# Patient Record
Sex: Female | Born: 1965 | Race: White | Hispanic: No | Marital: Married | Smoking: Never smoker
Health system: Southern US, Community
[De-identification: ages and names within clinical notes are randomized; demographics above are authoritative.]

## PROBLEM LIST (undated history)

## (undated) DIAGNOSIS — K5989 Other specified functional intestinal disorders: Secondary | ICD-10-CM

## (undated) DIAGNOSIS — K589 Irritable bowel syndrome without diarrhea: Secondary | ICD-10-CM

## (undated) DIAGNOSIS — E079 Disorder of thyroid, unspecified: Secondary | ICD-10-CM

## (undated) HISTORY — PX: OTHER SURGICAL HISTORY: SHX169

## (undated) HISTORY — PX: KNEE SURGERY: SHX244

## (undated) HISTORY — DX: Disorder of thyroid, unspecified: E07.9

---
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
## 2000-11-02 ENCOUNTER — Other Ambulatory Visit: Admission: RE | Admit: 2000-11-02 | Discharge: 2000-11-02 | Payer: Self-pay | Admitting: Obstetrics & Gynecology

## 2002-03-17 ENCOUNTER — Other Ambulatory Visit: Admission: RE | Admit: 2002-03-17 | Discharge: 2002-03-17 | Payer: Self-pay | Admitting: Obstetrics & Gynecology

## 2003-05-14 ENCOUNTER — Other Ambulatory Visit: Admission: RE | Admit: 2003-05-14 | Discharge: 2003-05-14 | Payer: Self-pay | Admitting: Obstetrics & Gynecology

## 2004-08-18 ENCOUNTER — Other Ambulatory Visit: Admission: RE | Admit: 2004-08-18 | Discharge: 2004-08-18 | Payer: Self-pay | Admitting: Obstetrics and Gynecology

## 2007-03-31 ENCOUNTER — Ambulatory Visit (HOSPITAL_COMMUNITY): Admission: RE | Admit: 2007-03-31 | Discharge: 2007-03-31 | Payer: Self-pay | Admitting: Obstetrics & Gynecology

## 2009-01-02 ENCOUNTER — Encounter: Admission: RE | Admit: 2009-01-02 | Discharge: 2009-01-02 | Payer: Self-pay | Admitting: Surgery

## 2009-03-06 ENCOUNTER — Encounter: Admission: RE | Admit: 2009-03-06 | Discharge: 2009-03-06 | Payer: Self-pay | Admitting: Gastroenterology

## 2011-01-29 ENCOUNTER — Ambulatory Visit: Payer: Managed Care, Other (non HMO)

## 2011-01-29 DIAGNOSIS — R1084 Generalized abdominal pain: Secondary | ICD-10-CM

## 2011-01-29 DIAGNOSIS — R509 Fever, unspecified: Secondary | ICD-10-CM

## 2011-01-29 DIAGNOSIS — R05 Cough: Secondary | ICD-10-CM

## 2011-02-16 ENCOUNTER — Ambulatory Visit: Payer: Managed Care, Other (non HMO) | Admitting: Internal Medicine

## 2011-05-25 ENCOUNTER — Encounter (INDEPENDENT_AMBULATORY_CARE_PROVIDER_SITE_OTHER): Payer: Self-pay | Admitting: Surgery

## 2011-06-09 ENCOUNTER — Telehealth (INDEPENDENT_AMBULATORY_CARE_PROVIDER_SITE_OTHER): Payer: Self-pay | Admitting: Surgery

## 2011-06-18 ENCOUNTER — Telehealth (INDEPENDENT_AMBULATORY_CARE_PROVIDER_SITE_OTHER): Payer: Self-pay | Admitting: Surgery

## 2011-06-18 DIAGNOSIS — E042 Nontoxic multinodular goiter: Secondary | ICD-10-CM | POA: Insufficient documentation

## 2011-06-18 NOTE — Telephone Encounter (Signed)
Patient had question regarding taking cholestyramine with thyroid hormone.  Discussed with patient.  She will have TSH level checked later this summer at time of GYN visit. Velora Heckler, MD, [facility] Office: [phone]

## 2011-06-23 ENCOUNTER — Encounter: Payer: Self-pay | Admitting: Emergency Medicine

## 2012-01-25 ENCOUNTER — Ambulatory Visit: Payer: Managed Care, Other (non HMO) | Admitting: Family Medicine

## 2012-01-25 VITALS — BP 116/70 | HR 78 | Temp 98.2°F | Resp 18 | Ht 65.0 in | Wt 159.0 lb

## 2012-01-25 DIAGNOSIS — B369 Superficial mycosis, unspecified: Secondary | ICD-10-CM

## 2012-01-25 MED ORDER — KETOCONAZOLE 2 % EX CREA: TOPICAL_CREAM | Freq: Two times a day (BID) | CUTANEOUS | Status: DC

## 2012-01-25 NOTE — Progress Notes (Signed)
[facility]:  Office Visit  Chief Complaint:  Chief Complaint  Patient presents with  . Rash    on torso/chest since Friday  . ?ringworm on thigh    x 2 weeks    HPI: Crystal Mason is a 47 y.o. female who complains of  Spot on inner thigh which she noticed 2 weeks ago and treated it with OTC antifungal cream x 5 days. Did not gp away, did not spread, did not decrease n size, it just stayed the same.Friday started noticing little spots on chest. No itching unless she gets warm, start having tingling. This morning started have spots underneath her bilateral breast.   Past Medical History  Diagnosis Date  . Thyroid disease    Past Surgical History  Procedure Date  . Tonsillectomy 1973  . Knee surgery left  . Dnc    History   Social History  . Marital Status: Married    Spouse Name: N/A    Number of Children: N/A  . Years of Education: N/A   Social History Main Topics  . Smoking status: Never Smoker   . Smokeless tobacco: None  . Alcohol Use: No  . Drug Use: No  . Sexually Active: Yes   Other Topics Concern  . None   Social History Narrative  . None   Family History  Problem Relation Age of Onset  . Diabetes Father    Allergies  Allergen Reactions  . Sulfa Antibiotics Rash   Prior to Admission medications   Medication Sig Start Date End Date Taking? Authorizing Provider  Cholecalciferol (VITAMIN D) 2000 UNITS CAPS Take by mouth.   Yes Historical Provider, MD  FeFum-FePoly-FA-B Cmp-C-Biot (INTEGRA PLUS PO) Take by mouth.   Yes Historical Provider, MD  levothyroxine (SYNTHROID, LEVOTHROID) 100 MCG tablet Take 100 mcg by mouth daily.   Yes Historical Provider, MD  Multiple Vitamins-Calcium (ONE-A-DAY WOMENS PO) Take by mouth.   Yes Historical Provider, MD  Norethindrone Acet-Ethinyl Est (JUNEL 1/20 PO) Take by mouth.   Yes Historical Provider, MD  Probiotic Product (RESTORA PO) Take by mouth.   Yes Historical Provider, MD     ROS: The  patient denies fevers, chills, night sweats, unintentional weight loss, chest pain, palpitations, wheezing, dyspnea on exertion, nausea, vomiting, abdominal pain, dysuria, hematuria, melena, numbness, weakness, or tingling.   All other systems have been reviewed and were otherwise negative with the exception of those mentioned in the HPI and as above.    PHYSICAL EXAM: Filed Vitals:   01/25/12 0842  BP: 116/70  Pulse: 78  Temp: 98.2 F (36.8 C)  Resp: 18   Filed Vitals:   01/25/12 0842  Height: 5\' 5"  (1.651 m)  Weight: 159 lb (72.122 kg)   Body mass index is 26.46 kg/(m^2).  General: Alert, no acute distress HEENT:  Normocephalic, atraumatic, oropharynx patent.  Cardiovascular:  Regular rate and rhythm, no rubs murmurs or gallops.  No Carotid bruits, radial pulse intact. No pedal edema.  Respiratory: Clear to auscultation bilaterally.  No wheezes, rales, or rhonchi.  No cyanosis, no use of accessory musculature GI: No organomegaly, abdomen is soft and non-tender, positive bowel sounds.  No masses. Skin: + 1x1 inch ring worm , well circumscribed, red beefy border left inner groin Neurologic: Facial musculature symmetric. Psychiatric: Patient is appropriate throughout our interaction. Lymphatic: No cervical lymphadenopathy Musculoskeletal: Gait intact.   LABS: No results found for this or any previous visit.   EKG/XRAY:   Primary read interpreted  by Dr. Conley Rolls at [facility].   ASSESSMENT/PLAN: Encounter Diagnosis  Name Primary?  . Fungal infection of skin Yes   Rx Ketoconazole If no improvement then will give Diflucan PO F/u prn    Marrah Vanevery PHUONG, DO 01/25/2012 10:01 AM

## 2012-01-29 ENCOUNTER — Telehealth: Payer: Self-pay

## 2012-01-29 MED ORDER — FLUCONAZOLE 150 MG PO TABS: 150.0000 mg | ORAL_TABLET | Freq: Once | ORAL | Status: DC

## 2012-01-29 NOTE — Telephone Encounter (Signed)
Pt was seen for a rash recently and it is not getting any better -she was told all she had to do was to call and let us know and we would call something else in for her  She has requested that due to her chronic stomach issues, that whatever we call in it doesn't have stomach side effects  Best number [phone]

## 2012-01-29 NOTE — Telephone Encounter (Signed)
Per Dr Conley Rolls plan Diflucan sent in.

## 2012-01-31 ENCOUNTER — Telehealth: Payer: Self-pay

## 2012-01-31 NOTE — Telephone Encounter (Signed)
Patient was seen Monday by Dr. Conley Rolls and was given topical ointment but it didn't work. Then an oral RX was called in but it didn't work either. She is leaving Tuesday on a business trip and was wondering if there was something she could take by injection or maybe a stronger RX because its spreading (rash?). Please call patient back at [phone].

## 2012-02-01 ENCOUNTER — Ambulatory Visit: Payer: Managed Care, Other (non HMO) | Admitting: Family Medicine

## 2012-02-01 VITALS — BP 110/70 | HR 80 | Temp 98.6°F | Resp 18 | Wt 162.0 lb

## 2012-02-01 DIAGNOSIS — L42 Pityriasis rosea: Secondary | ICD-10-CM

## 2012-02-01 NOTE — Telephone Encounter (Signed)
Unfortunately, I think she will have to come in for this to be evaluated again since both treatments Dr. Conley Rolls had outlined in her plan have failed, and it is worsening.

## 2012-02-01 NOTE — Progress Notes (Signed)
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  Now multiple oval raised plaques on torso extending to posterior neck  Objective:  Typical pityriasis rosea rash  Plan:  Patient info

## 2012-02-01 NOTE — Patient Instructions (Addendum)
Pityriasis Rosea Pityriasis rosea is a rash which is probably caused by a virus. It generally starts as a scaly, red patch on the trunk (the area of the body that a t-shirt would cover) but does not appear on sun exposed areas. The rash is usually preceded by an initial larger spot called the "herald patch" a week or more before the rest of the rash appears. Generally within one to two days the rash appears rapidly on the trunk, upper arms, and sometimes the upper legs. The rash usually appears as flat, oval patches of scaly pink color. The rash can also be raised and one is able to feel it with a finger. The rash can also be finely crinkled and may slough off leaving a ring of scale around the spot. Sometimes a mild sore throat is present with the rash. It usually affects children and young adults in the spring and autumn. Women are more frequently affected than men. TREATMENT  Pityriasis rosea is a self-limited condition. This means it goes away within 4 to 8 weeks without treatment. The spots may persist for several months, especially in darker-colored skin after the rash has resolved and healed. Benadryl and steroid creams may be used if itching is a problem. SEEK MEDICAL CARE IF:   Your rash does not go away or persists longer than three months.  You develop fever and joint pain.  You develop severe headache and confusion.  You develop breathing difficulty, vomiting and/or extreme weakness. Document Released: 02/11/2001 Document Revised: 03/30/2011 Document Reviewed: 03/02/2008 Exit[employer] Patient Information 2013 [employer].  

## 2012-02-01 NOTE — Telephone Encounter (Signed)
Advised pt to RTC for re-eval and she agreed to come back in today bf she goes OOT.

## 2012-03-23 ENCOUNTER — Ambulatory Visit: Payer: Managed Care, Other (non HMO) | Admitting: Family Medicine

## 2012-03-23 VITALS — BP 116/76 | HR 70 | Temp 98.2°F | Resp 16 | Ht 65.5 in | Wt 161.6 lb

## 2012-03-23 DIAGNOSIS — R0789 Other chest pain: Secondary | ICD-10-CM

## 2012-03-23 DIAGNOSIS — R1011 Right upper quadrant pain: Secondary | ICD-10-CM

## 2012-03-23 DIAGNOSIS — G8929 Other chronic pain: Secondary | ICD-10-CM

## 2012-03-23 DIAGNOSIS — R071 Chest pain on breathing: Secondary | ICD-10-CM

## 2012-03-23 LAB — POCT CBC
Hemoglobin: 13.2 g/dL (ref 12.2–16.2)
Lymph, poc: 2.8 (ref 0.6–3.4)
MCH, POC: 30.6 pg (ref 27–31.2)
MCHC: 32 g/dL (ref 31.8–35.4)
MCV: 95.5 fL (ref 80–97)
MID (cbc): 0.5 (ref 0–0.9)
MPV: 9.1 fL (ref 0–99.8)

## 2012-03-23 LAB — POCT URINALYSIS DIPSTICK
Bilirubin, UA: NEGATIVE
Glucose, UA: NEGATIVE
Urobilinogen, UA: 0.2

## 2012-03-23 LAB — POCT UA - MICROSCOPIC ONLY
Crystals, Ur, HPF, POC: NEGATIVE
Yeast, UA: NEGATIVE

## 2012-03-23 LAB — COMPREHENSIVE METABOLIC PANEL
Albumin: 4.2 g/dL (ref 3.5–5.2)
BUN: 11 mg/dL (ref 6–23)
CO2: 27 mEq/L (ref 19–32)
Calcium: 9.8 mg/dL (ref 8.4–10.5)
Chloride: 102 mEq/L (ref 96–112)
Glucose, Bld: 84 mg/dL (ref 70–99)
Potassium: 4.1 mEq/L (ref 3.5–5.3)
Total Protein: 7.3 g/dL (ref 6.0–8.3)

## 2012-03-23 NOTE — Progress Notes (Signed)
Subjective: Patient has been having problems for couple of weeks. She got a tight squeezing sensation around her lower chest wall after doing some exercise one day. Since there is continued to hurt in the right flank or low chest area. It started hurting through to the epigastrium and is persisted. Times it hurts and is tender in the epigastrium. No nausea or vomiting. Some history of chronic GI loose bowels and irritable bowel syndrome-type stuff. Has been being worked up in the past. She's never had her gallbladder evaluation. She has not had any surgeries on her abdomen. Her last menstrual cycle was 2 years ago.  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   objective: No acute distress. Chest clear. Heart regular without murmurs. Throat clear. Neck supple without nodes. Abdomen has normal bowel sounds, soft without organomegaly mass or tenderness. No CVA tenderness. Good motion of her spine.  Plan: Right flank and right upper quadrant abdominal pain, etiology unclear. Microscopic hematuria in a patient has a history of chronic microscopic hematuria     Results for orders placed in visit on 03/23/12  POCT CBC      Result Value Range   WBC 8.1  4.6 - 10.2 K/uL   Lymph, poc 2.8  0.6 - 3.4   POC LYMPH PERCENT 35.0  10 - 50 %L   MID (cbc) 0.5  0 - 0.9   POC MID % 6.7  0 - 12 %M   POC Granulocyte 4.7  2 - 6.9   Granulocyte percent 58.3  37 - 80 %G   RBC 4.31  4.04 - 5.48 M/uL   Hemoglobin 13.2  12.2 - 16.2 g/dL   HCT, POC 81.1  91.4 - 47.9 %   MCV 95.5  80 - 97 fL   MCH, POC 30.6  27 - 31.2 pg   MCHC 32.0  31.8 - 35.4 g/dL   RDW, POC 78.2     Platelet Count, POC 337  142 - 424 K/uL   MPV 9.1  0 - 99.8 fL  POCT UA - MICROSCOPIC ONLY      Result Value Range   WBC, Ur, HPF, POC 0-3     RBC, urine, microscopic 1-8     Bacteria, U Microscopic neg     Mucus, UA neg     Epithelial cells, urine per micros 1-4     Crystals, Ur, HPF, POC neg     Casts, Ur, LPF, POC neg     Yeast, UA  neg    POCT URINALYSIS DIPSTICK      Result Value Range   Color, UA yellow     Clarity, UA clear     Glucose, UA neg     Bilirubin, UA neg     Ketones, UA neg     Spec Grav, UA 1.015     Blood, UA mod     pH, UA 6.5     Protein, UA neg     Urobilinogen, UA 0.2     Nitrite, UA neg     Leukocytes, UA Negative     Check the send off labs when they come back. Went ahead and ordered a gallbladder ultrasound on her. Gave no medications today. We'll see what she does looks like at that point. Thank you

## 2012-03-23 NOTE — Patient Instructions (Signed)
Gallbladder ultrasound  Call or return if worse  Lab work is pending

## 2012-03-24 ENCOUNTER — Encounter: Payer: Self-pay | Admitting: Family Medicine

## 2012-03-31 ENCOUNTER — Other Ambulatory Visit: Payer: Self-pay

## 2012-09-24 ENCOUNTER — Other Ambulatory Visit: Payer: Self-pay | Admitting: Family Medicine

## 2012-10-03 ENCOUNTER — Encounter: Payer: Self-pay | Admitting: Internal Medicine

## 2012-10-03 ENCOUNTER — Ambulatory Visit (INDEPENDENT_AMBULATORY_CARE_PROVIDER_SITE_OTHER): Payer: Private Health Insurance - Indemnity | Admitting: Internal Medicine

## 2012-10-03 VITALS — BP 114/69 | HR 70 | Temp 97.3°F | Resp 16 | Ht 65.5 in | Wt 162.0 lb

## 2012-10-03 DIAGNOSIS — R197 Diarrhea, unspecified: Secondary | ICD-10-CM

## 2012-10-03 DIAGNOSIS — E039 Hypothyroidism, unspecified: Secondary | ICD-10-CM | POA: Insufficient documentation

## 2012-10-03 DIAGNOSIS — N951 Menopausal and female climacteric states: Secondary | ICD-10-CM | POA: Insufficient documentation

## 2012-10-03 DIAGNOSIS — R109 Unspecified abdominal pain: Secondary | ICD-10-CM

## 2012-10-03 LAB — CBC WITH DIFFERENTIAL/PLATELET
Basophils Absolute: 0 10*3/uL (ref 0.0–0.1)
Hemoglobin: 13.2 g/dL (ref 12.0–15.0)
Lymphocytes Relative: 33 % (ref 12–46)
Monocytes Absolute: 0.4 10*3/uL (ref 0.1–1.0)
Monocytes Relative: 7 % (ref 3–12)
Neutro Abs: 3.2 10*3/uL (ref 1.7–7.7)
Neutrophils Relative %: 57 % (ref 43–77)
RBC: 4.11 MIL/uL (ref 3.87–5.11)
WBC: 5.7 10*3/uL (ref 4.0–10.5)

## 2012-10-03 LAB — COMPREHENSIVE METABOLIC PANEL
AST: 18 U/L (ref 0–37)
Alkaline Phosphatase: 63 U/L (ref 39–117)
Calcium: 9.8 mg/dL (ref 8.4–10.5)
Potassium: 4.5 mEq/L (ref 3.5–5.3)
Total Bilirubin: 0.7 mg/dL (ref 0.3–1.2)

## 2012-10-03 NOTE — Progress Notes (Signed)
Subjective:    Patient ID: Crystal Mason, female    DOB: 06/10/1965, 47 y.o.   MRN: [id number]  HPI  Crystal Mason is here with her husband for her first visit here for consult regarding diarrhea and abd pain  - she is wondering if female hormone and menopause is causing the problem .  All history below is per pt and her husband I have no old records  She has PMH of borderline hyperlipidemia, "low iron",  Hypothyroidism, and endometrial polyps.    She has complicated PMH of chronic watery diarrhea/abd bloating/abd discomfort for the past 2 years.  Extensive work-up by both Dr. Dulce Sellar and Dr. Lanell Matar including endoscopies with biopsies ( I do not have pathology), gastric emptying study,  CT enteropraphy etc.  She describes that she will have episodes of watery diarrhea and bloating with generalized abd discomfort at times in upper quadrants that began approx 2 years ago.    She has been advised to take cholestyramine which she took for a very short course,  hyoscamine for a very short course and she has been treated  With antibiotics for bacterial overgrowth.  She tells me that she has been told she has 3 possibilities to her diarrhea: sludge in her GB,  Bacterial overgrowth (treated), or rapid transit .  (gastric emptying study showed borderline rapid emptying at 2 hours and increased gastric emptying at 3 hours.    Sheis on biotin, a probiotic and integra plus currently.  Symptoms not related to certain foods, no blood in stool, no report of rashes, weightl loss, fever,  Htn, or ulcer disease.  She is currently on Junel for contraception.  She did have NP check her [facility] when she was off her Oc's for about 1 week and her FSH was 90 miu/ml on 9/30.  Estradiol <11.8,    Allergies  Allergen Reactions  . Sulfa Antibiotics Rash   Past Medical History  Diagnosis Date  . Thyroid disease    Past Surgical History  Procedure Laterality Date  . Tonsillectomy  1973  . Knee surgery  left  . Dnc     History    Social History  . Marital Status: Married    Spouse Name: N/A    Number of Children: N/A  . Years of Education: N/A   Occupational History  . Not on file.   Social History Main Topics  . Smoking status: Never Smoker   . Smokeless tobacco: Not on file  . Alcohol Use: No  . Drug Use: No  . Sexual Activity: Yes   Other Topics Concern  . Not on file   Social History Narrative  . No narrative on file   Family History  Problem Relation Age of Onset  . Diabetes Father    Patient Active Problem List   Diagnosis Date Noted  . 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 Cholecalciferol (VITAMIN D) 2000 UNITS CAPS Take by mouth.      . FeFum-FePoly-FA-B Cmp-C-Biot (INTEGRA PLUS PO) Take by mouth.      . levothyroxine (SYNTHROID, LEVOTHROID) 100 MCG tablet Take 100 mcg by mouth daily.      . Multiple Vitamins-Calcium (ONE-A-DAY WOMENS PO) Take by mouth.      . Norethindrone Acet-Ethinyl Est (JUNEL 1/20 PO) Take by mouth.      . Probiotic Product (RESTORA PO) Take by mouth.       No current  facility-administered medications on file prior to visit.      Review of Systems See HPI    Objective:   Physical Exam Physical Exam  Nursing note and vitals reviewed.  Constitutional: She is oriented to person, place, and time. She appears well-developed and well-nourished.  HENT:  Head: Normocephalic and atraumatic.  Cardiovascular: Normal rate and regular rhythm. Exam reveals no gallop and no friction rub.  No murmur heard.  Pulmonary/Chest: Breath sounds normal. She has no wheezes. She has no rales.  Neurological: She is alert and oriented to person, place, and time.  Skin: Skin is warm and dry.  Psychiatric: She has a normal mood and affect. Her behavior is normal.        Assessment & Plan:  S/S complex chronic diarrhea/bloating/abd discomfort  I explained that is it unlikely  that lowered estradiol or progesterone is directly causing her symptom complex.  Additionally she has been on Junel without relief of her GI symptoms.  ADvised if she decides to come off of her Junel she will need alternative contraception for 12 months .    Will need old records from GI MD's. As prior workup has been extensive.    She really does not fit a clinical symptomatology of a neuroendocrine syndrome such as a VIPoma or a gastrinoma but will need to discuss with Gi Md's.  I feel it is worth an 8-12 week trail of cholestyramine to see if alleviating the diarrhea will help her symptoms. I advised pt to follow Dr. Malena Edman instructions regarding cholestyramine administration  She is to return to see me in 4-5 weeks.    She wishes to continue with her primary MD Dr. Ricki Miller for now

## 2012-10-04 LAB — T3, FREE: T3, Free: 2.7 pg/mL (ref 2.3–4.2)

## 2012-10-04 LAB — ANA: Anti Nuclear Antibody(ANA): NEGATIVE

## 2012-10-04 LAB — TSH: TSH: 2.159 u[IU]/mL (ref 0.350–4.500)

## 2012-10-04 LAB — T4, FREE: Free T4: 1.41 ng/dL (ref 0.80–1.80)

## 2012-10-04 LAB — CORTISOL: Cortisol, Plasma: 16.4 ug/dL

## 2012-10-04 NOTE — Patient Instructions (Signed)
See me in 4 weeks  [facility]

## 2012-10-05 ENCOUNTER — Encounter: Payer: Self-pay | Admitting: *Deleted

## 2012-10-06 ENCOUNTER — Telehealth: Payer: Self-pay | Admitting: Internal Medicine

## 2012-10-06 ENCOUNTER — Other Ambulatory Visit: Payer: Self-pay | Admitting: Internal Medicine

## 2012-10-06 NOTE — Telephone Encounter (Signed)
Spoke with pt  She had seen Dr. Lanell Matar again and will be empirically trying cholestyramine    Informed of labs  Advised to follow up with me in one month

## 2012-10-06 NOTE — Telephone Encounter (Signed)
Left message on mobile and at work for pt to call office

## 2012-10-18 ENCOUNTER — Other Ambulatory Visit: Payer: Self-pay | Admitting: *Deleted

## 2012-10-18 ENCOUNTER — Telehealth: Payer: Self-pay | Admitting: *Deleted

## 2012-10-18 ENCOUNTER — Other Ambulatory Visit: Payer: Self-pay | Admitting: Internal Medicine

## 2012-10-18 DIAGNOSIS — R197 Diarrhea, unspecified: Secondary | ICD-10-CM

## 2012-10-18 NOTE — Telephone Encounter (Signed)
LVM message to return call regarding labs

## 2012-10-24 ENCOUNTER — Telehealth: Payer: Self-pay | Admitting: *Deleted

## 2012-10-24 NOTE — Telephone Encounter (Signed)
Pt picked up labs and had them drawn on Friday will contact lab regarding sample

## 2012-10-24 NOTE — Telephone Encounter (Signed)
VIP test takes 7-10 days and Gastrin takes 5 days. Blood has been received in lab and is in process

## 2012-10-25 LAB — GASTRIN: Gastrin: <15 pg/mL (ref <=100)

## 2012-10-26 ENCOUNTER — Telehealth: Payer: Self-pay | Admitting: Internal Medicine

## 2012-10-26 NOTE — Telephone Encounter (Signed)
Spoke with Dr. Lanell Matar    Will check gastrin and VIP levels  She has been advised by him to take cholestyramine and bentyl  .  No unifying diagnosis by his extensive investigation as to the cause of her symptoms

## 2012-11-04 ENCOUNTER — Encounter: Payer: Self-pay | Admitting: *Deleted

## 2012-11-07 ENCOUNTER — Ambulatory Visit: Payer: Private Health Insurance - Indemnity | Admitting: Internal Medicine

## 2012-11-21 ENCOUNTER — Ambulatory Visit (INDEPENDENT_AMBULATORY_CARE_PROVIDER_SITE_OTHER): Payer: Private Health Insurance - Indemnity | Admitting: Internal Medicine

## 2012-11-21 ENCOUNTER — Encounter: Payer: Self-pay | Admitting: Internal Medicine

## 2012-11-21 VITALS — BP 101/64 | HR 67 | Temp 98.2°F | Resp 18 | Wt 164.0 lb

## 2012-11-21 DIAGNOSIS — R109 Unspecified abdominal pain: Secondary | ICD-10-CM

## 2012-11-21 DIAGNOSIS — R197 Diarrhea, unspecified: Secondary | ICD-10-CM

## 2012-11-21 NOTE — Progress Notes (Signed)
  Subjective:    Patient ID: Crystal Mason, female    DOB: October 02, 1965, 47 y.o.   MRN: [id number]  HPI Crystal Mason is here for follow up  See labs  Gastrin level is normal and VIP is low range.  She is having some improvement with cholestyramine once a day.  She recently had a repeat breath test which showed high fermentation but o bacterial overgrowth. She was advised by Dr. Lanell Matar to follow a FODMAP diet which she plans to start after the holidays.    Abd cramping temporally related to loose stools  Allergies  Allergen Reactions  . Sulfa Antibiotics Rash   Past Medical History  Diagnosis Date  . Thyroid disease    Past Surgical History  Procedure Laterality Date  . Tonsillectomy  1973  . Knee surgery  left  . Dnc     History   Social History  . Marital Status: Married    Spouse Name: N/A    Number of Children: N/A  . Years of Education: N/A   Occupational History  . Not on file.   Social History Main Topics  . Smoking status: Never Smoker   . Smokeless tobacco: Never Used  . Alcohol Use: No  . Drug Use: No  . Sexual Activity: Yes    Birth Control/ Protection: Post-menopausal   Other Topics Concern  . Not on file   Social History Narrative  . No narrative on file   Family History  Problem Relation Age of Onset  . Diabetes Father    Patient Active Problem List   Diagnosis Date Noted  . Abdominal cramping 11/21/2012  . Perimenopause 10/03/2012  . Hypothyroidism 10/03/2012  . Diarrhea 10/03/2012  . Abdominal pain 10/03/2012  . 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 Biotin 3 MG TABS Take by mouth.      . Cholecalciferol (VITAMIN D) 2000 UNITS CAPS Take by mouth.      . FeFum-FePoly-FA-B Cmp-C-Biot (INTEGRA PLUS PO) Take by mouth.      . levothyroxine (SYNTHROID, LEVOTHROID) 100 MCG tablet Take 100 mcg by mouth daily.      . Multiple Vitamins-Calcium (ONE-A-DAY  WOMENS PO) Take by mouth.      . Norethindrone Acet-Ethinyl Est (JUNEL 1/20 PO) Take by mouth.      . Probiotic Product (RESTORA PO) Take by mouth.       No current facility-administered medications on file prior to visit.       Review of Systems    see HPI Objective:   Physical Exam  Physical Exam  Nursing note and vitals reviewed.  Constitutional: She is oriented to person, place, and time. She appears well-developed and well-nourished.  HENT:  Head: Normocephalic and atraumatic.  Cardiovascular: Normal rate and regular rhythm. Exam reveals no gallop and no friction rub.  No murmur heard.  Pulmonary/Chest: Breath sounds normal. She has no wheezes. She has no rales.  Neurological: She is alert and oriented to person, place, and time.  Skin: Skin is warm and dry.  Psychiatric: She has a normal mood and affect. Her behavior is normal.            Assessment & Plan:  Diarrhea  Some improvement  Continue cholestyramine.   Will start FODMAP diet in ealry 2015  Abd cramping  Related to diarrhea  She has follow up with Dr.  Lanell Matar and will discuss with hime

## 2012-11-21 NOTE — Patient Instructions (Signed)
Follow up with me prn    Keep appt with Dr. Lanell Matar

## 2013-11-20 ENCOUNTER — Encounter: Payer: Self-pay | Admitting: Internal Medicine

## 2014-03-30 ENCOUNTER — Emergency Department (HOSPITAL_COMMUNITY)
Admission: EM | Admit: 2014-03-30 | Discharge: 2014-03-31 | Disposition: A | Discharge status: Home / Self Care | Payer: Private Health Insurance - Indemnity | Attending: Emergency Medicine | Admitting: Emergency Medicine

## 2014-03-30 ENCOUNTER — Ambulatory Visit (INDEPENDENT_AMBULATORY_CARE_PROVIDER_SITE_OTHER): Payer: Managed Care, Other (non HMO)

## 2014-03-30 ENCOUNTER — Ambulatory Visit (INDEPENDENT_AMBULATORY_CARE_PROVIDER_SITE_OTHER): Payer: Managed Care, Other (non HMO) | Admitting: Emergency Medicine

## 2014-03-30 ENCOUNTER — Encounter (HOSPITAL_COMMUNITY): Payer: Self-pay | Admitting: Emergency Medicine

## 2014-03-30 VITALS — BP 110/72 | HR 70 | Temp 98.3°F | Resp 18 | Ht 66.0 in | Wt 164.0 lb

## 2014-03-30 DIAGNOSIS — R110 Nausea: Secondary | ICD-10-CM | POA: Diagnosis not present

## 2014-03-30 DIAGNOSIS — K5660 Unspecified intestinal obstruction: Secondary | ICD-10-CM

## 2014-03-30 DIAGNOSIS — E079 Disorder of thyroid, unspecified: Secondary | ICD-10-CM | POA: Insufficient documentation

## 2014-03-30 DIAGNOSIS — Z79899 Other long term (current) drug therapy: Secondary | ICD-10-CM | POA: Insufficient documentation

## 2014-03-30 DIAGNOSIS — R1084 Generalized abdominal pain: Secondary | ICD-10-CM | POA: Diagnosis not present

## 2014-03-30 DIAGNOSIS — R109 Unspecified abdominal pain: Secondary | ICD-10-CM | POA: Diagnosis present

## 2014-03-30 DIAGNOSIS — K5909 Other constipation: Secondary | ICD-10-CM | POA: Insufficient documentation

## 2014-03-30 LAB — POCT CBC
GRANULOCYTE PERCENT: 65.4 % (ref 37–80)
HEMATOCRIT: 39.5 % (ref 37.7–47.9)
Hemoglobin: 12.3 g/dL (ref 12.2–16.2)
LYMPH, POC: 3.1 (ref 0.6–3.4)
MCH: 30.2 pg (ref 27–31.2)
MCHC: 31.2 g/dL — AB (ref 31.8–35.4)
MCV: 96.8 fL (ref 80–97)
MID (cbc): 0.6 (ref 0–0.9)
MPV: 7.7 fL (ref 0–99.8)
PLATELET COUNT, POC: 306 10*3/uL (ref 142–424)
POC Granulocyte: 6.9 (ref 2–6.9)
POC LYMPH PERCENT: 29.2 %L (ref 10–50)
POC MID %: 5.4 %M (ref 0–12)
RBC: 4.08 M/uL (ref 4.04–5.48)
RDW, POC: 15.3 %
WBC: 10.6 10*3/uL — AB (ref 4.6–10.2)

## 2014-03-30 LAB — I-STAT CHEM 8, ED
BUN: 9 mg/dL (ref 6–23)
Calcium, Ion: 1.17 mmol/L (ref 1.12–1.23)
Chloride: 103 mmol/L (ref 96–112)
Creatinine, Ser: 0.8 mg/dL (ref 0.50–1.10)
Glucose, Bld: 89 mg/dL (ref 70–99)
HCT: 38 % (ref 36.0–46.0)
Hemoglobin: 12.9 g/dL (ref 12.0–15.0)
POTASSIUM: 3.6 mmol/L (ref 3.5–5.1)
SODIUM: 141 mmol/L (ref 135–145)
TCO2: 20 mmol/L (ref 0–100)

## 2014-03-30 MED ORDER — POLYETHYLENE GLYCOL 3350 17 G PO PACK: 17.0000 g | PACK | Freq: Every day | ORAL | Status: DC

## 2014-03-30 MED ORDER — FLEET ENEMA 7-19 GM/118ML RE ENEM
1.0000 | ENEMA | Freq: Once | RECTAL | Status: AC
  Administered 2014-03-30: 1 via RECTAL
  Filled 2014-03-30: qty 1

## 2014-03-30 NOTE — ED Notes (Signed)
Placed bedside commode in patients room  

## 2014-03-30 NOTE — ED Provider Notes (Signed)
CSN: [id number]     Arrival date & time 03/30/14  1936 History  This chart was scribed for Crystal Canalavid H Wing Gfeller, MD by Crystal Mason, ED Scribe. This patient was seen in room A10C/A10C and the patient's care was started at 11:06 PM.    Chief Complaint  Patient presents with  . Abdominal Pain   The history is provided by the patient. No language interpreter was used.     HPI Comments: Crystal Mason is a 49 y.o. female who presents to the Emergency Department complaining of abdominal pain that began earlier this morning upon waking up. Pt describes it as a cramping sensation. Pt admits to similar symptoms in the past but states that the pain usually goes away in a few hours. She also complains of nausea but denies vomiting. Pt reports that she does not have normal bowel movements. In the last 2 weeks she has either passed small pieces of stool or water. Pt has had extensive evaluation at [facility] and has a GI doctor. She had CT Abdomen Pelvis done last year which showed constipation. Pt also had normal gastric emptying study done last year as well.  Pt was seen at PCPs office two weeks ago and had an X Ray done at that time as well which showed constipation as well. PCP told pt to increase fiber intake. Pt was seen at Urgent Care earlier today and sent to the ED concerning possible small bowel obstruction. The final read on the X Ray done at Urgent Care showed constipation. She reports that she has not been started on any constipation medication by any of her doctors.   PCP- Dr. Ricki MillerPang Gastroenterologist - Dr. Lanell MatarMishra   Past Medical History  Diagnosis Date  . Thyroid disease    Past Surgical History  Procedure Laterality Date  . Tonsillectomy  1973  . Knee surgery  left  . Dnc     Family History  Problem Relation Age of Onset  . Diabetes Father    History  Substance Use Topics  . Smoking status: Never Smoker   . Smokeless tobacco: Never Used  . Alcohol Use: No   OB History     Gravida Para Term Preterm AB TAB SAB Ectopic Multiple Living   3    1  1   2      Review of Systems  Gastrointestinal: Positive for nausea, abdominal pain and constipation. Negative for vomiting.  All other systems reviewed and are negative.     Allergies  Sulfa antibiotics  Home Medications   Prior to Admission medications   Medication Sig Start Date End Date Taking? Authorizing Provider  Biotin 3 MG TABS Take 3 mg by mouth daily.    Yes Historical Provider, MD  Cholecalciferol (VITAMIN D) 2000 UNITS CAPS Take 2,000 Units by mouth daily.    Yes Historical Provider, MD  cholestyramine Lanetta Inch(QUESTRAN) 4 GM/DOSE powder Take 4 g by mouth 3 (three) times daily with meals.   Yes Historical Provider, MD  cycloSPORINE (RESTASIS) 0.05 % ophthalmic emulsion Place 1 drop into both eyes 2 (two) times daily.   Yes Historical Provider, MD  FeFum-FePoly-FA-B Cmp-C-Biot (INTEGRA PLUS PO) Take 1 capsule by mouth daily.    Yes Historical Provider, MD  levothyroxine (SYNTHROID, LEVOTHROID) 100 MCG tablet Take 100 mcg by mouth daily.   Yes Historical Provider, MD  Multiple Vitamins-Calcium (ONE-A-DAY WOMENS PO) Take 1 tablet by mouth daily.    Yes Historical Provider, MD  Norethindrone Acet-Ethinyl Est (JUNEL 1/20  PO) Take 1 tablet by mouth daily.    Yes Historical Provider, MD  Probiotic Product (RESTORA PO) Take 1 tablet by mouth daily.    Yes Historical Provider, MD   Triage Vitals: BP 133/81 mmHg  Pulse 72  Temp(Src) 98.9 F (37.2 C) (Oral)  Resp 15  Ht  (1.651 m)  Wt 164 lb (74.39 kg)  BMI 27.29 kg/m2  SpO2 100%  LMP 03/05/2010   Physical Exam  Constitutional: She is oriented to person, place, and time. She appears well-developed and well-nourished. No distress.  HENT:  Head: Normocephalic and atraumatic.  Eyes: Conjunctivae and EOM are normal.  Neck: Neck supple. No tracheal deviation present.  Cardiovascular: Normal rate, regular rhythm and normal heart sounds.   Pulmonary/Chest:  Effort normal and breath sounds normal. No respiratory distress.  Abdominal: She exhibits distension (Slightly distended. ). There is no tenderness.  Genitourinary:  Rectal Exam: Brown stool; No stool impaction.   Musculoskeletal: Normal range of motion.  Neurological: She is alert and oriented to person, place, and time.  Skin: Skin is warm and dry.  Psychiatric: She has a normal mood and affect. Her behavior is normal.  Nursing note and vitals reviewed.   ED Course  Procedures (including critical care time)  DIAGNOSTIC STUDIES: Oxygen Saturation is 100% on RA, normal by my interpretation.    COORDINATION OF CARE: 11:12 PM-Discussed treatment plan which includes enema with pt at bedside and pt agreed to plan.   Labs Review Labs Reviewed  I-STAT CHEM 8, ED    Imaging Review Dg Abd 1 View  03/30/2014   CLINICAL DATA:  49 year old female with several year history of cramping abdominal pain and irregular stool.  EXAM: ABDOMEN - 1 VIEW  COMPARISON:  03/14/2014.  FINDINGS: Gas and stool are noted throughout the colon, including the distal rectum. Moderate to large volume of stool in the distal colon and rectum. No pathologic dilatation of small bowel. No pneumoperitoneum. Several phleboliths are noted in the pelvis. Additionally, there is a larger calcification, likely a calcified fibroid.  IMPRESSION: 1. Nonobstructive bowel gas pattern. 2. No pneumoperitoneum. 3. Moderate to large volume of stool, suggestive of constipation.   Electronically Signed   By: Trudie Reed M.D.   On: 03/30/2014 19:18     EKG Interpretation None      MDM   Final diagnoses:  None   Crystal Mason is a 49 y.o. female here with constipation. Xray showed no SBO, just constipation similar to 2 weeks ago. Abdomen distended but not tender. No stool impaction. Patient's K normal so I doubt olgilvie's syndrome. Had an enema with great results and repeat abdominal exam show less distention. Recommend miralax  daily and f/u with GI.   I personally performed the services described in this documentation, which was scribed in my presence. The recorded information has been reviewed and is accurate.    Crystal Canal, MD 03/30/14 [phone]

## 2014-03-30 NOTE — Discharge Instructions (Signed)
Take miralax daily until you have regular bowel movements.   Follow up with your GI doctor.   Return to ER if you have worse abdominal cramps, severe constipation, vomiting.

## 2014-03-30 NOTE — ED Notes (Signed)
Pt states that she has been having abdominal pain since she woke up this AM. Pt states that she has had many tests run on her GI system because of abnormal bowel movements. Pt states that her bowel movements are rarely "normal", and usually consist of loose stools. Pt states that her last "normal" bowel movement was last weekend, and it was loose. Pt saw her PCP 2 weeks ago, and was told that her colon was "full of stool".

## 2014-03-30 NOTE — ED Notes (Signed)
Pt c/o abd pain, onset last pm.  Pt st's she went to Urgent Care and was sent to ED for possible bowel obstruction.  Last BM today (small).

## 2014-03-30 NOTE — Progress Notes (Signed)
[facility] [address], [place] [address] [phone]  Date:  03/30/2014   Name:  Crystal MouldingRhonda K Mason   DOB:  06/10/1965   MRN:  [id number]  PCP:  Juline PatchPANG,RICHARD, MD    Chief Complaint: Abdominal Pain and Abdominal Cramping   History of Present Illness:  Crystal Mason is a 49 y.o. very pleasant female patient who presents with the following:  Has several year history of cramping pain in abdomen associated with variable stools; alternating formed to watery stools Has undergone an exhaustive evaluation both lab and radiology and [facility] She has a regular GI buy there. She has pain across her lower abdomen.   No nausea or vomiting.   Feels bloated.  Some belching The patient has no complaint of blood, mucous, or pus in her stools. No dysuria, urgency or frequency. No vaginal discharge or dyspareunia. No fever or chills Normal appetite Says stools presently are "mushy". No improvement with over the counter medications or other home remedies.  Denies other complaint or health concern today.     Patient Active Problem List   Diagnosis Date Noted  . Abdominal cramping 11/21/2012  . Perimenopause 10/03/2012  . Hypothyroidism 10/03/2012  . Diarrhea 10/03/2012  . Abdominal pain 10/03/2012  . 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 Thyroid disease     Past Surgical History  Procedure Laterality Date  . Tonsillectomy  1973  . Knee surgery  left  . Dnc      History  Substance Use Topics  . Smoking status: Never Smoker   . Smokeless tobacco: Never Used  . Alcohol Use: No    Family History  Problem Relation Age of Onset  . Diabetes Father     Allergies  Allergen Reactions  . Sulfa Antibiotics Rash    Medication list has been reviewed and updated.  Current Outpatient Prescriptions on File Prior to Visit  Medication Sig Dispense Refill  . Biotin 3 MG  TABS Take by mouth.    . Cholecalciferol (VITAMIN D) 2000 UNITS CAPS Take by mouth.    . cycloSPORINE (RESTASIS) 0.05 % ophthalmic emulsion Place 1 drop into both eyes 2 (two) times daily.    Marland Kitchen. FeFum-FePoly-FA-B Cmp-C-Biot (INTEGRA PLUS PO) Take by mouth.    . levothyroxine (SYNTHROID, LEVOTHROID) 100 MCG tablet Take 100 mcg by mouth daily.    . Multiple Vitamins-Calcium (ONE-A-DAY WOMENS PO) Take by mouth.    . Norethindrone Acet-Ethinyl Est (JUNEL 1/20 PO) Take by mouth.    . Probiotic Product (RESTORA PO) Take by mouth.    . cholestyramine (QUESTRAN) 4 GM/DOSE powder Take 4 g by mouth 3 (three) times daily with meals.     No current facility-administered medications on file prior to visit.    Review of Systems:  As per HPI, otherwise negative.    Physical Examination: Filed Vitals:   03/30/14 1824  BP: 110/72  Pulse: 70  Temp: 98.3 F (36.8 C)  Resp: 18   Filed Vitals:   03/30/14 1824  Height: 5\' 6"  (1.676 m)  Weight: 164 lb (74.39 kg)   Body mass index is 26.48 kg/(m^2). Ideal Body Weight: Weight in (lb) to have BMI = 25: 154.6  GEN: WDWN, NAD, Non-toxic, A & O x 3 HEENT: Atraumatic, Normocephalic. Neck supple. No masses, No LAD. Ears and Nose: No external deformity. CV: RRR, No M/G/R. No JVD. No thrill. No  extra heart sounds. PULM: CTA B, no wheezes, crackles, rhonchi. No retractions. No resp. distress. No accessory muscle use. ABD: S, NT, ND, +BS. No rebound. No HSM. EXTR: No c/c/e NEURO Normal gait.  PSYCH: Normally interactive. Conversant. Not depressed or anxious appearing.  Calm demeanor.    Assessment and Plan: Abdominal pain ?bowel obstruction ER  Signed,  Phillips Odor, MD   [facility] reading (PRIMARY) by  Dr. Dareen Piano  Massive dilated loops of bowel .

## 2014-03-31 NOTE — ED Notes (Signed)
Pt had a small movement after the enema. The stool was formed, and pt states that she feels better.

## 2014-04-25 ENCOUNTER — Ambulatory Visit
Admission: RE | Admit: 2014-04-25 | Discharge: 2014-04-25 | Disposition: A | Discharge status: Home / Self Care | Payer: 59 | Source: Ambulatory Visit | Attending: Gastroenterology | Admitting: Gastroenterology

## 2014-04-25 ENCOUNTER — Other Ambulatory Visit: Payer: Self-pay | Admitting: Gastroenterology

## 2014-04-25 DIAGNOSIS — K5900 Constipation, unspecified: Secondary | ICD-10-CM

## 2014-05-22 ENCOUNTER — Other Ambulatory Visit: Payer: Self-pay | Admitting: *Deleted

## 2014-05-22 ENCOUNTER — Ambulatory Visit
Admission: RE | Admit: 2014-05-22 | Discharge: 2014-05-22 | Disposition: A | Discharge status: Home / Self Care | Payer: 59 | Source: Ambulatory Visit | Attending: *Deleted | Admitting: *Deleted

## 2014-05-22 DIAGNOSIS — K5900 Constipation, unspecified: Secondary | ICD-10-CM

## 2014-08-29 ENCOUNTER — Ambulatory Visit
Admission: RE | Admit: 2014-08-29 | Discharge: 2014-08-29 | Disposition: A | Discharge status: Home / Self Care | Payer: 59 | Source: Ambulatory Visit | Attending: Internal Medicine | Admitting: Internal Medicine

## 2014-08-29 ENCOUNTER — Other Ambulatory Visit: Payer: Self-pay | Admitting: Internal Medicine

## 2014-08-29 ENCOUNTER — Encounter (INDEPENDENT_AMBULATORY_CARE_PROVIDER_SITE_OTHER): Payer: Self-pay

## 2014-08-29 DIAGNOSIS — K5901 Slow transit constipation: Secondary | ICD-10-CM

## 2014-09-17 ENCOUNTER — Ambulatory Visit
Admission: RE | Admit: 2014-09-17 | Discharge: 2014-09-17 | Disposition: A | Discharge status: Home / Self Care | Payer: 59 | Source: Ambulatory Visit | Attending: Internal Medicine | Admitting: Internal Medicine

## 2014-09-17 ENCOUNTER — Other Ambulatory Visit: Payer: Self-pay | Admitting: Internal Medicine

## 2014-09-17 DIAGNOSIS — K5900 Constipation, unspecified: Secondary | ICD-10-CM

## 2015-01-08 ENCOUNTER — Encounter (HOSPITAL_COMMUNITY): Payer: Self-pay | Admitting: Emergency Medicine

## 2015-01-08 ENCOUNTER — Emergency Department (HOSPITAL_COMMUNITY)
Admission: EM | Admit: 2015-01-08 | Discharge: 2015-01-08 | Disposition: A | Discharge status: Home / Self Care | Payer: 59 | Attending: Emergency Medicine | Admitting: Emergency Medicine

## 2015-01-08 DIAGNOSIS — Z79899 Other long term (current) drug therapy: Secondary | ICD-10-CM | POA: Insufficient documentation

## 2015-01-08 DIAGNOSIS — J069 Acute upper respiratory infection, unspecified: Secondary | ICD-10-CM | POA: Diagnosis not present

## 2015-01-08 DIAGNOSIS — R0981 Nasal congestion: Secondary | ICD-10-CM | POA: Diagnosis present

## 2015-01-08 DIAGNOSIS — R079 Chest pain, unspecified: Secondary | ICD-10-CM | POA: Insufficient documentation

## 2015-01-08 DIAGNOSIS — E079 Disorder of thyroid, unspecified: Secondary | ICD-10-CM | POA: Diagnosis not present

## 2015-01-08 LAB — I-STAT TROPONIN, ED: Troponin i, poc: 0.01 ng/mL (ref 0.00–0.08)

## 2015-01-08 MED ORDER — BENZONATATE 100 MG PO CAPS: 100.0000 mg | ORAL_CAPSULE | Freq: Three times a day (TID) | ORAL | Status: DC

## 2015-01-08 NOTE — ED Notes (Signed)
EDP at bedside  

## 2015-01-08 NOTE — ED Notes (Signed)
Pt in reporting congestion, cold S/S for past 3 weeks. Started having L sided CP tonight at 0200. Stated it radiated to L arm and was a burning sensation. Denies CP at this present time

## 2015-01-08 NOTE — Discharge Instructions (Signed)
We saw you in the ER for the congestion, cough. We think what you have is a viral syndrome - the treatment for which is symptomatic relief only, and your body will fight the infection off in a few days.  See your primary care doctor in 1 week if the symptoms dont improve.  We saw you in the ER for the chest pain as well. All of our cardiac workup is normal, including labs, EKG. We are not sure what is causing your discomfort, but we feel comfortable sending you home at this time. The workup in the ER is not complete, and you should follow up with your primary care doctor for further evaluation.   Cough, Adult Coughing is a reflex that clears your throat and your airways. Coughing helps to heal and protect your lungs. It is normal to cough occasionally, but a cough that happens with other symptoms or lasts a long time may be a sign of a condition that needs treatment. A cough may last only 2-3 weeks (acute), or it may last longer than 8 weeks (chronic). CAUSES Coughing is commonly caused by:  Breathing in substances that irritate your lungs.  A viral or bacterial respiratory infection.  Allergies.  Asthma.  Postnasal drip.  Smoking.  Acid backing up from the stomach into the esophagus (gastroesophageal reflux).  Certain medicines.  Chronic lung problems, including COPD (or rarely, lung cancer).  Other medical conditions such as heart failure. HOME CARE INSTRUCTIONS  Pay attention to any changes in your symptoms. Take these actions to help with your discomfort:  Take medicines only as told by your health care provider.  If you were prescribed an antibiotic medicine, take it as told by your health care provider. Do not stop taking the antibiotic even if you start to feel better.  Talk with your health care provider before you take a cough suppressant medicine.  Drink enough fluid to keep your urine clear or pale yellow.  If the air is dry, use a cold steam vaporizer or  humidifier in your bedroom or your home to help loosen secretions.  Avoid anything that causes you to cough at work or at home.  If your cough is worse at night, try sleeping in a semi-upright position.  Avoid cigarette smoke. If you smoke, quit smoking. If you need help quitting, ask your health care provider.  Avoid caffeine.  Avoid alcohol.  Rest as needed. SEEK MEDICAL CARE IF:   You have new symptoms.  You cough up pus.  Your cough does not get better after 2-3 weeks, or your cough gets worse.  You cannot control your cough with suppressant medicines and you are losing sleep.  You develop pain that is getting worse or pain that is not controlled with pain medicines.  You have a fever.  You have unexplained weight loss.  You have night sweats. SEEK IMMEDIATE MEDICAL CARE IF:  You cough up blood.  You have difficulty breathing.  Your heartbeat is very fast.   This information is not intended to replace advice given to you by your health care provider. Make sure you discuss any questions you have with your health care provider.   Document Released: 07/04/2010 Document Revised: 09/26/2014 Document Reviewed: 03/14/2014 Elsevier Interactive Patient Education 2016 [employer]  Nonspecific Chest Pain  Chest pain can be caused by many different conditions. There is always a chance that your pain could be related to something serious, such as a heart attack or a blood clot  in your lungs. Chest pain can also be caused by conditions that are not life-threatening. If you have chest pain, it is very important to follow up with your health care provider. CAUSES  Chest pain can be caused by:  Heartburn.  Pneumonia or bronchitis.  Anxiety or stress.  Inflammation around your heart (pericarditis) or lung (pleuritis or pleurisy).  A blood clot in your lung.  A collapsed lung (pneumothorax). It can develop suddenly on its own (spontaneous pneumothorax) or from trauma to  the chest.  Shingles infection (varicella-zoster virus).  Heart attack.  Damage to the bones, muscles, and cartilage that make up your chest wall. This can include:  Bruised bones due to injury.  Strained muscles or cartilage due to frequent or repeated coughing or overwork.  Fracture to one or more ribs.  Sore cartilage due to inflammation (costochondritis). RISK FACTORS  Risk factors for chest pain may include:  Activities that increase your risk for trauma or injury to your chest.  Respiratory infections or conditions that cause frequent coughing.  Medical conditions or overeating that can cause heartburn.  Heart disease or family history of heart disease.  Conditions or health behaviors that increase your risk of developing a blood clot.  Having had chicken pox (varicella zoster). SIGNS AND SYMPTOMS Chest pain can feel like:  Burning or tingling on the surface of your chest or deep in your chest.  Crushing, pressure, aching, or squeezing pain.  Dull or sharp pain that is worse when you move, cough, or take a deep breath.  Pain that is also felt in your back, neck, shoulder, or arm, or pain that spreads to any of these areas. Your chest pain may come and go, or it may stay constant. DIAGNOSIS Lab tests or other studies may be needed to find the cause of your pain. Your health care provider may have you take a test called an ambulatory ECG (electrocardiogram). An ECG records your heartbeat patterns at the time the test is performed. You may also have other tests, such as:  Transthoracic echocardiogram (TTE). During echocardiography, sound waves are used to create a picture of all of the heart structures and to look at how blood flows through your heart.  Transesophageal echocardiogram (TEE).This is a more advanced imaging test that obtains images from inside your body. It allows your health care provider to see your heart in finer detail.  Cardiac monitoring. This  allows your health care provider to monitor your heart rate and rhythm in real time.  Holter monitor. This is a portable device that records your heartbeat and can help to diagnose abnormal heartbeats. It allows your health care provider to track your heart activity for several days, if needed.  Stress tests. These can be done through exercise or by taking medicine that makes your heart beat more quickly.  Blood tests.  Imaging tests. TREATMENT  Your treatment depends on what is causing your chest pain. Treatment may include:  Medicines. These may include:  Acid blockers for heartburn.  Anti-inflammatory medicine.  Pain medicine for inflammatory conditions.  Antibiotic medicine, if an infection is present.  Medicines to dissolve blood clots.  Medicines to treat coronary artery disease.  Supportive care for conditions that do not require medicines. This may include:  Resting.  Applying heat or cold packs to injured areas.  Limiting activities until pain decreases. HOME CARE INSTRUCTIONS  If you were prescribed an antibiotic medicine, finish it all even if you start to feel better.  Avoid  any activities that bring on chest pain.  Do not use any tobacco products, including cigarettes, chewing tobacco, or electronic cigarettes. If you need help quitting, ask your health care provider.  Do not drink alcohol.  Take medicines only as directed by your health care provider.  Keep all follow-up visits as directed by your health care provider. This is important. This includes any further testing if your chest pain does not go away.  If heartburn is the cause for your chest pain, you may be told to keep your head raised (elevated) while sleeping. This reduces the chance that acid will go from your stomach into your esophagus.  Make lifestyle changes as directed by your health care provider. These may include:  Getting regular exercise. Ask your health care provider to suggest  some activities that are safe for you.  Eating a heart-healthy diet. A registered dietitian can help you to learn healthy eating options.  Maintaining a healthy weight.  Managing diabetes, if necessary.  Reducing stress. SEEK MEDICAL CARE IF:  Your chest pain does not go away after treatment.  You have a rash with blisters on your chest.  You have a fever. SEEK IMMEDIATE MEDICAL CARE IF:   Your chest pain is worse.  You have an increasing cough, or you cough up blood.  You have severe abdominal pain.  You have severe weakness.  You faint.  You have chills.  You have sudden, unexplained chest discomfort.  You have sudden, unexplained discomfort in your arms, back, neck, or jaw.  You have shortness of breath at any time.  You suddenly start to sweat, or your skin gets clammy.  You feel nauseous or you vomit.  You suddenly feel light-headed or dizzy.  Your heart begins to beat quickly, or it feels like it is skipping beats. These symptoms may represent a serious problem that is an emergency. Do not wait to see if the symptoms will go away. Get medical help right away. Call your local emergency services (911 in the [place] Do not drive yourself to the hospital.   This information is not intended to replace advice given to you by your health care provider. Make sure you discuss any questions you have with your health care provider.   Document Released: 10/15/2004 Document Revised: 01/26/2014 Document Reviewed: 08/11/2013 Elsevier Interactive Patient Education [employer]

## 2015-01-08 NOTE — ED Provider Notes (Signed)
CSN: [id number]     Arrival date & time 01/08/15  9147 History   First MD Initiated Contact with Patient 01/08/15 0340     Chief Complaint  Patient presents with  . Chest Pain  . Nasal Congestion     (Consider location/radiation/quality/duration/timing/severity/associated sxs/prior Treatment) Patient is a 49 y.o. female presenting with chest pain. The history is provided by the patient.  Chest Pain Pain location:  L chest Pain quality: burning and sharp   Pain radiates to:  Does not radiate Pain radiates to the back: no   Pain severity:  Moderate Onset quality:  Sudden Duration:  30 minutes Timing:  Constant Progression:  Resolved Chronicity:  New Context: at rest   Context: not breathing, not lifting, no movement, no stress and no trauma   Worsened by:  Nothing tried Ineffective treatments:  None tried Associated symptoms: cough   Associated symptoms: no back pain, no diaphoresis, no dizziness, no nausea, no palpitations and no syncope   Risk factors: no coronary artery disease, no diabetes mellitus, not obese, no prior DVT/PE and no smoking     Past Medical History  Diagnosis Date  . Thyroid disease    Past Surgical History  Procedure Laterality Date  . Tonsillectomy  1973  . Knee surgery  left  . Dnc     Family History  Problem Relation Age of Onset  . Diabetes Father    Social History  Substance Use Topics  . Smoking status: Never Smoker   . Smokeless tobacco: Never Used  . Alcohol Use: No   OB History    Gravida Para Term Preterm AB TAB SAB Ectopic Multiple Living   Review of Systems  Constitutional: Negative for diaphoresis.  HENT: Positive for congestion. Negative for dental problem.   Respiratory: Positive for cough.   Cardiovascular: Positive for chest pain. Negative for palpitations and syncope.  Gastrointestinal: Negative for nausea.  Musculoskeletal: Negative for back pain.  Neurological: Negative for dizziness.       Allergies  Sulfa antibiotics  Home Medications   Prior to Admission medications   Medication Sig Start Date End Date Taking? Authorizing Provider  Cholecalciferol (VITAMIN D) 2000 UNITS CAPS Take 2,000 Units by mouth daily.    Yes Historical Provider, MD  cycloSPORINE (RESTASIS) 0.05 % ophthalmic emulsion Place 1 drop into both eyes 2 (two) times daily.   Yes Historical Provider, MD  levothyroxine (SYNTHROID, LEVOTHROID) 100 MCG tablet Take 100 mcg by mouth daily.   Yes Historical Provider, MD  Linaclotide (LINZESS) 290 MCG CAPS capsule Take 290 mcg by mouth daily.   Yes Historical Provider, MD  polyethylene glycol (MIRALAX / GLYCOLAX) packet Take 17 g by mouth daily. 03/30/14  Yes Richardean Canal, MD  Probiotic Product (RESTORA PO) Take 1 tablet by mouth daily.    Yes Historical Provider, MD  UNABLE TO FIND Take 1 mg by mouth daily. Med Name: Resolor    Yes Historical Provider, MD  benzonatate (TESSALON) 100 MG capsule Take 1 capsule (100 mg total) by mouth every 8 (eight) hours. 01/08/15   Oliverio Cho Rhunette Croft, MD   BP 109/72 mmHg  Pulse 67  Temp(Src) 99 F (37.2 C) (Oral)  Resp 15  Ht  (1.651 m)  Wt 155 lb (70.308 kg)  BMI 25.79 kg/m2  SpO2 98%  LMP 03/05/2010 Physical Exam  Constitutional: She is oriented to person, place, and time. She appears well-developed.  HENT:  Head: Normocephalic and atraumatic.  Eyes: EOM are normal.  Neck: Normal range of motion. Neck supple.  Cardiovascular: Normal rate and intact distal pulses.   Pulmonary/Chest: Effort normal. No respiratory distress. She has no wheezes. She has no rales.  Abdominal: Bowel sounds are normal.  Musculoskeletal: She exhibits no edema.  Neurological: She is alert and oriented to person, place, and time.  Skin: Skin is warm and dry.  Nursing note and vitals reviewed.   ED Course  Procedures (including critical care time) Labs Review Labs Reviewed  Rosezena SensorI-STAT TROPOININ, ED    Imaging Review No results  found. I have personally reviewed and evaluated these images and lab results as part of my medical decision-making.   EKG Interpretation   Date/Time:  Tuesday January 08 2015 04:34:01 EST Ventricular Rate:  68 PR Interval:  150 QRS Duration: 95 QT Interval:  386 QTC Calculation: 410 R Axis:   6 Text Interpretation:  Sinus rhythm No acute changes No old tracing to  compare Confirmed by Rhunette CroftNANAVATI, MD, Janey GentaANKIT [phone]([id number]) on 01/08/2015 6:09:02 AM      MDM   Final diagnoses:  URI (upper respiratory infection)  Nonspecific chest pain   Pt has atypical chest discomfort L sided earlier today that brought her to the hospital. Pain was intermittent, and lasted just a few minutes before it resolved spontaneously. Leading upto today, she hasnt had any exertional chest pain , dib. She has no cardiac risk factors at all. HEAR score frankly is 1, for age.  She has some ongoing uri like sx as well, with sinus congestion. Will add tessalon for symptoms relief.  EKG and trop ordered - if neg, will ask patient if she wants to wait for serial trop.     Derwood KaplanAnkit Jendayi Berling, MD 01/08/15 [phone]

## 2016-02-09 IMAGING — CR DG ABDOMEN 1V
1 series · 1 of 1 positions shown · non-contrast
Comparison: 03/14/2014.

CLINICAL DATA: 49-year-old female with several year history of
cramping abdominal pain and irregular stool.

EXAM:
ABDOMEN - 1 VIEW

[AP]
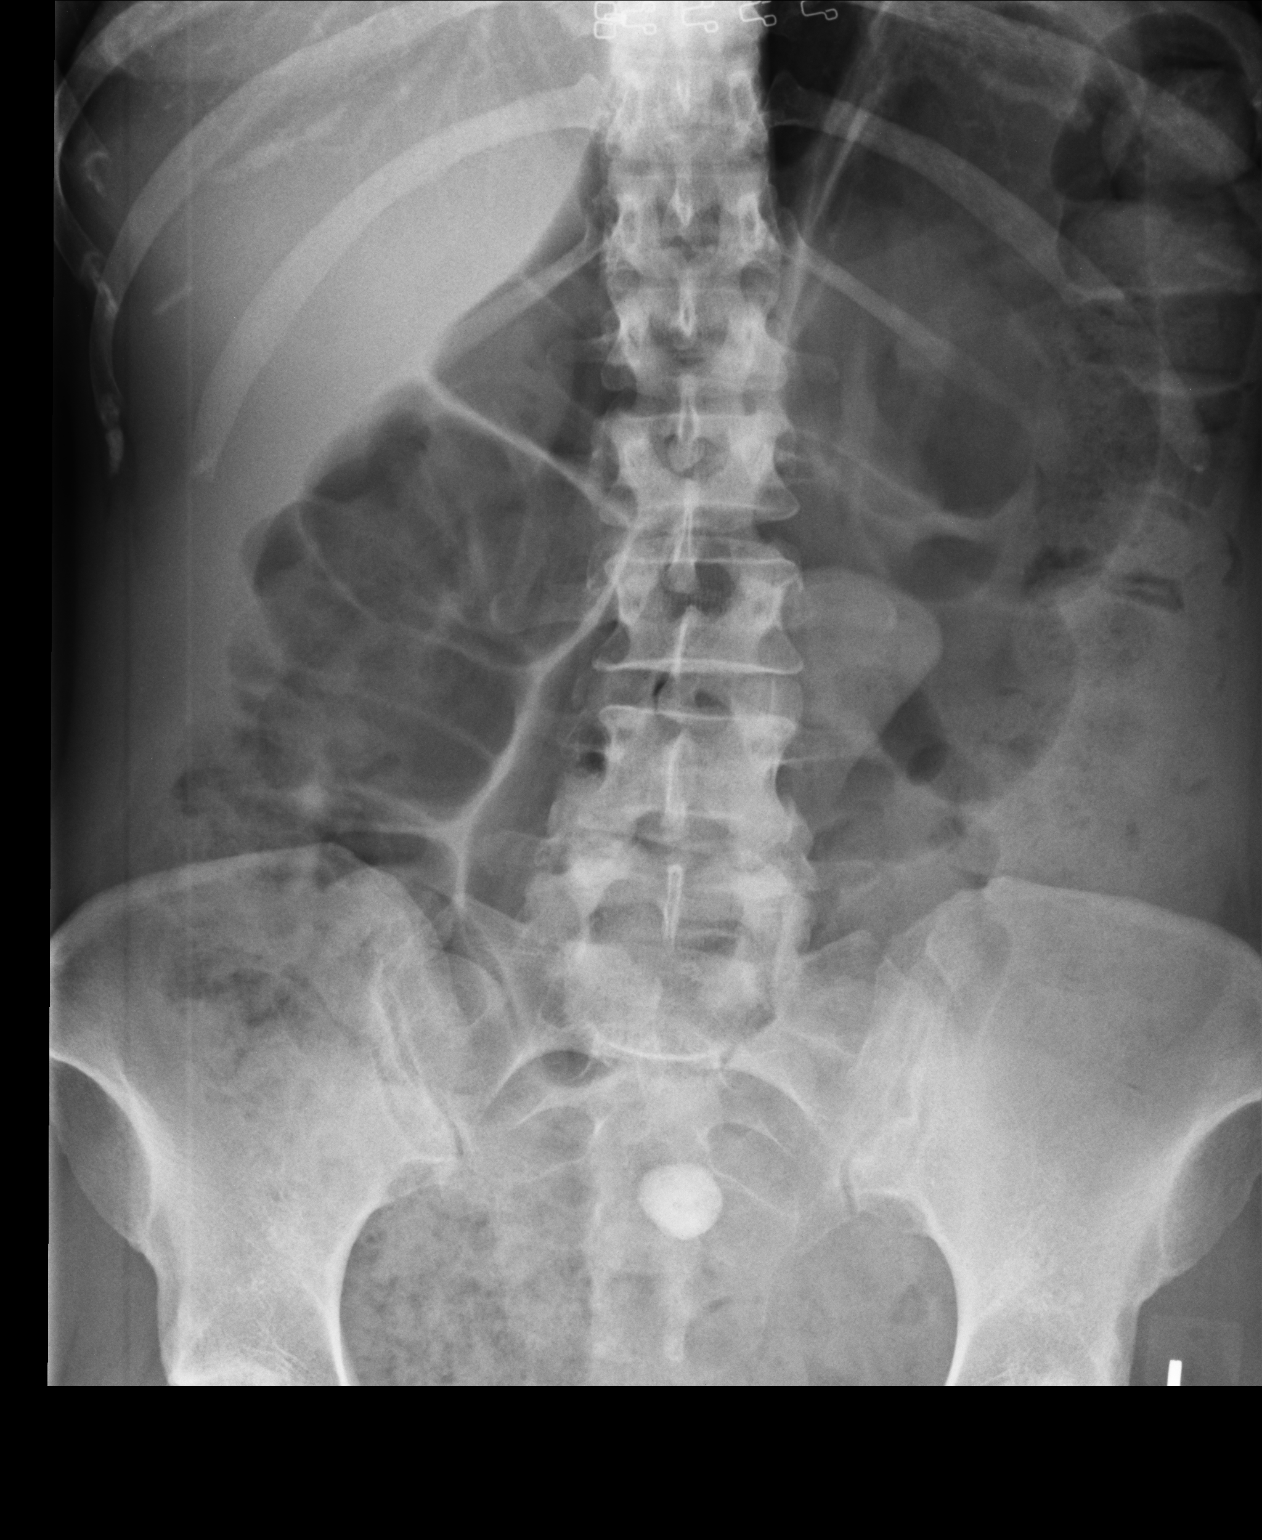

[1 of 1 positions shown; findings below may reference images not displayed]

FINDINGS: Gas and stool are noted throughout the colon, including the distal
rectum. Moderate to large volume of stool in the distal colon and
rectum. No pathologic dilatation of small bowel. No
pneumoperitoneum. Several phleboliths are noted in the pelvis.
Additionally, there is a larger calcification, likely a calcified
fibroid.
IMPRESSION: 1. Nonobstructive bowel gas pattern.
2. No pneumoperitoneum.
3. Moderate to large volume of stool, suggestive of constipation.

## 2016-02-12 ENCOUNTER — Other Ambulatory Visit: Payer: Self-pay | Admitting: Orthopaedic Surgery

## 2016-02-12 DIAGNOSIS — M25551 Pain in right hip: Secondary | ICD-10-CM

## 2016-02-21 ENCOUNTER — Ambulatory Visit
Admission: RE | Admit: 2016-02-21 | Discharge: 2016-02-21 | Disposition: A | Discharge status: Home / Self Care | Payer: 59 | Source: Ambulatory Visit | Attending: Orthopaedic Surgery | Admitting: Orthopaedic Surgery

## 2016-02-21 DIAGNOSIS — M25551 Pain in right hip: Secondary | ICD-10-CM

## 2016-05-28 ENCOUNTER — Ambulatory Visit
Admission: RE | Admit: 2016-05-28 | Discharge: 2016-05-28 | Disposition: A | Discharge status: Home / Self Care | Payer: 59 | Source: Ambulatory Visit | Attending: Internal Medicine | Admitting: Internal Medicine

## 2016-05-28 ENCOUNTER — Other Ambulatory Visit: Payer: Self-pay | Admitting: Internal Medicine

## 2016-05-28 DIAGNOSIS — R410 Disorientation, unspecified: Secondary | ICD-10-CM

## 2016-05-28 DIAGNOSIS — R51 Headache: Principal | ICD-10-CM

## 2016-05-28 DIAGNOSIS — R519 Headache, unspecified: Secondary | ICD-10-CM

## 2016-07-30 ENCOUNTER — Encounter: Payer: Self-pay | Admitting: Neurology

## 2016-07-30 ENCOUNTER — Ambulatory Visit (INDEPENDENT_AMBULATORY_CARE_PROVIDER_SITE_OTHER): Payer: Managed Care, Other (non HMO) | Admitting: Neurology

## 2016-07-30 VITALS — BP 123/78 | HR 67 | Ht 65.0 in | Wt 164.0 lb

## 2016-07-30 DIAGNOSIS — R253 Fasciculation: Secondary | ICD-10-CM | POA: Diagnosis not present

## 2016-07-30 NOTE — Progress Notes (Signed)
Reason for visit: Muscle twitches  Referring physician: Dr. Nickolas Madrid Crystal Mason is a 51 y.o. female  History of present illness:  [name] is a 51 year old right-handed white female with a history of onset of muscle twitches that started in the left hand in mid May 2018. The patient noted that the twitches were relatively frequent over a two-week period of time, and the twitches sometimes will cause adduction of the left thumb. The patient has had a two-week period of no muscle twitches, and then the twitches have come back again and are fairly infrequent. The patient has had migratory muscle twitches that may occur on the arms or legs in different areas, sometimes proximally and sometimes distally. The patient denies any numbness of the extremities, she has not had any pain in the neck or in the low back or pain down the arms or legs. She denies any weakness or atrophy of the muscle, she is able to work out on a regular basis without difficulty. The patient denies issues with balance or difficulty controlling the bowels or the bladder. She is not had any change in speech or swallowing or headaches or any visual field changes. She has undergone a CT scan of the brain that was unremarkable. She is sent to this office for an evaluation.  Past Medical History:  Diagnosis Date  . Thyroid disease     Past Surgical History:  Procedure Laterality Date  . dnc    . KNEE SURGERY  left  . TONSILLECTOMY  1973    Family History  Problem Relation Age of Onset  . Diabetes Father     Social history:  reports that she has never smoked. She has never used smokeless tobacco. She reports that she does not drink alcohol or use drugs.  Medications:  Prior to Admission medications   Medication Sig Start Date End Date Taking? Authorizing Provider  benzonatate (TESSALON) 100 MG capsule Take 1 capsule (100 mg total) by mouth every 8 (eight) hours. 01/08/15   Derwood Kaplan, MD    Cholecalciferol (VITAMIN D) 2000 UNITS CAPS Take 2,000 Units by mouth daily.     [provider]  cycloSPORINE (RESTASIS) 0.05 % ophthalmic emulsion Place 1 drop into both eyes 2 (two) times daily.    [provider]  JUNEL 1/20 1-20 MG-MCG tablet TAKE 1 TABLET BY MOUTH EVERY DAY CONTINUOUSLY.4PACKS FOR 84 DAYS 06/24/16   [provider]  levothyroxine (SYNTHROID, LEVOTHROID) 100 MCG tablet Take 100 mcg by mouth daily.    [provider]  Linaclotide (LINZESS) 290 MCG CAPS capsule Take 290 mcg by mouth daily.    [provider]  LINZESS 145 MCG CAPS capsule TAKE 1 CAPSULE (145 MCG) BY MOUTH DAILY ON AN EMPTY STOMACH BEFORE FIRST MEAL OF THE DAY 05/06/16   [provider]  meloxicam (MOBIC) 15 MG tablet TAKE 1 TABLET BY MOUTH WITH FOOD AS NEEDED FOR PAIN AND SWELLING 04/25/16   [provider]  polyethylene glycol (MIRALAX / GLYCOLAX) packet Take 17 g by mouth daily. 03/30/14   Charlynne Pander, MD  Probiotic Product (RESTORA PO) Take 1 tablet by mouth daily.     [provider]  UNABLE TO FIND Take 1 mg by mouth daily. Med Name: Resolor 1mg     [provider]      Allergies  Allergen Reactions  . Sulfa Antibiotics Rash    ROS:  Out of a complete 14 system review of symptoms, the  patient complains only of the following symptoms, and all other reviewed systems are negative.  Muscle twitches  Last menstrual period 03/05/2010.  Physical Exam  General: The patient is alert and cooperative at the time of the examination.  Eyes: Pupils are equal, round, and reactive to light. Discs are flat bilaterally.  Neck: The neck is supple, no carotid bruits are noted.  Respiratory: The respiratory examination is clear.  Cardiovascular: The cardiovascular examination reveals a regular rate and rhythm, no obvious murmurs or rubs are noted.  Skin: Extremities are without significant edema.  Neurologic Exam  Mental  status: The patient is alert and oriented x 3 at the time of the examination. The patient has apparent normal recent and remote memory, with an apparently normal attention span and concentration ability.  Cranial nerves: Facial symmetry is present. There is good sensation of the face to pinprick and soft touch bilaterally. The strength of the facial muscles and the muscles to head turning and shoulder shrug are normal bilaterally. Speech is well enunciated, no aphasia or dysarthria is noted. Extraocular movements are full. Visual fields are full. The tongue is midline, and the patient has symmetric elevation of the soft palate. No obvious hearing deficits are noted.  Motor: The motor testing reveals 5 over 5 strength of all 4 extremities. Good symmetric motor tone is noted throughout.  Sensory: Sensory testing is intact to pinprick, soft touch, vibration sensation, and position sense on all 4 extremities. No evidence of extinction is noted.  Coordination: Cerebellar testing reveals good finger-nose-finger and heel-to-shin bilaterally.  Gait and station: Gait is normal. Tandem gait is normal. Romberg is negative. No drift is seen.  Reflexes: Deep tendon reflexes are symmetric and normal bilaterally. Toes are downgoing bilaterally.   CT brain 05/28/16:  IMPRESSION: Normal noncontrast CT appearance of the brain.  * CT scan images were reviewed online. I agree with the written report.     Assessment/Plan:  1. Benign muscle twitches   The patient has no evidence of weakness, muscular atrophy, or reflex asymmetry on clinical examination. The episodes of muscle twitches are migratory, and likely are benign in nature. The patient denies any significant caffeine intake or increased stress levels or problems with insomnia. The clinical issues will be self monitored over time, the patient will contact our office if she believes that the problems are significantly worsening or she is developing  weakness or atrophy. Otherwise, no further workup will be initiated at this time.  Marlan Palau. Keith Parilee Hally MD 07/30/2016 8:24 AM  [facility] [address] [place], [place] [id number]  Phone [phone] Fax [phone]

## 2016-12-24 ENCOUNTER — Other Ambulatory Visit: Payer: Self-pay | Admitting: Internal Medicine

## 2016-12-24 DIAGNOSIS — E039 Hypothyroidism, unspecified: Secondary | ICD-10-CM

## 2016-12-30 ENCOUNTER — Ambulatory Visit
Admission: RE | Admit: 2016-12-30 | Discharge: 2016-12-30 | Disposition: A | Discharge status: Home / Self Care | Payer: 59 | Source: Ambulatory Visit | Attending: Internal Medicine | Admitting: Internal Medicine

## 2016-12-30 DIAGNOSIS — E039 Hypothyroidism, unspecified: Secondary | ICD-10-CM

## 2017-04-30 ENCOUNTER — Ambulatory Visit
Admission: RE | Admit: 2017-04-30 | Discharge: 2017-04-30 | Disposition: A | Discharge status: Home / Self Care | Payer: 59 | Source: Ambulatory Visit | Attending: Internal Medicine | Admitting: Internal Medicine

## 2017-04-30 ENCOUNTER — Other Ambulatory Visit: Payer: Self-pay | Admitting: Internal Medicine

## 2017-04-30 DIAGNOSIS — K5901 Slow transit constipation: Secondary | ICD-10-CM

## 2018-01-04 ENCOUNTER — Other Ambulatory Visit: Payer: Self-pay | Admitting: Internal Medicine

## 2018-01-04 DIAGNOSIS — R221 Localized swelling, mass and lump, neck: Secondary | ICD-10-CM

## 2018-01-07 ENCOUNTER — Ambulatory Visit
Admission: RE | Admit: 2018-01-07 | Discharge: 2018-01-07 | Disposition: A | Discharge status: Home / Self Care | Payer: 59 | Source: Ambulatory Visit | Attending: Internal Medicine | Admitting: Internal Medicine

## 2018-01-07 DIAGNOSIS — R221 Localized swelling, mass and lump, neck: Secondary | ICD-10-CM

## 2018-01-07 MED ORDER — IOPAMIDOL (ISOVUE-300) INJECTION 61%
75.0000 mL | Freq: Once | INTRAVENOUS | Status: AC | PRN
  Administered 2018-01-07: 75 mL via INTRAVENOUS

## 2018-05-25 ENCOUNTER — Other Ambulatory Visit: Payer: Self-pay

## 2018-05-25 ENCOUNTER — Emergency Department (HOSPITAL_BASED_OUTPATIENT_CLINIC_OR_DEPARTMENT_OTHER): Payer: 59

## 2018-05-25 ENCOUNTER — Emergency Department (HOSPITAL_BASED_OUTPATIENT_CLINIC_OR_DEPARTMENT_OTHER)
Admission: EM | Admit: 2018-05-25 | Discharge: 2018-05-26 | Disposition: A | Discharge status: Home / Self Care | Payer: 59 | Attending: Emergency Medicine | Admitting: Emergency Medicine

## 2018-05-25 DIAGNOSIS — R0789 Other chest pain: Secondary | ICD-10-CM | POA: Insufficient documentation

## 2018-05-25 DIAGNOSIS — R079 Chest pain, unspecified: Secondary | ICD-10-CM | POA: Diagnosis present

## 2018-05-25 DIAGNOSIS — Z79899 Other long term (current) drug therapy: Secondary | ICD-10-CM | POA: Insufficient documentation

## 2018-05-25 LAB — BASIC METABOLIC PANEL
Anion gap: 9 (ref 5–15)
BUN: 11 mg/dL (ref 6–20)
CO2: 24 mmol/L (ref 22–32)
Calcium: 8.8 mg/dL — ABNORMAL LOW (ref 8.9–10.3)
Chloride: 104 mmol/L (ref 98–111)
Creatinine, Ser: 1.08 mg/dL — ABNORMAL HIGH (ref 0.44–1.00)
GFR calc Af Amer: >60 mL/min (ref >60)
GFR calc non Af Amer: 59 mL/min — ABNORMAL LOW (ref >60)
Glucose, Bld: 103 mg/dL — ABNORMAL HIGH (ref 70–99)
Potassium: 3.7 mmol/L (ref 3.5–5.1)
Sodium: 137 mmol/L (ref 135–145)

## 2018-05-25 LAB — CBC
HCT: 38 % (ref 36.0–46.0)
Hemoglobin: 12.4 g/dL (ref 12.0–15.0)
MCH: 31.1 pg (ref 26.0–34.0)
MCHC: 32.6 g/dL (ref 30.0–36.0)
MCV: 95.2 fL (ref 80.0–100.0)
Platelets: 322 10*3/uL (ref 150–400)
RBC: 3.99 MIL/uL (ref 3.87–5.11)
RDW: 13.2 % (ref 11.5–15.5)
WBC: 10.3 10*3/uL (ref 4.0–10.5)
nRBC: 0 % (ref 0.0–0.2)

## 2018-05-25 LAB — TROPONIN I: Troponin I: <0.03 ng/mL (ref <0.03)

## 2018-05-25 NOTE — ED Triage Notes (Signed)
Pt reports back pain that began this evening that has radiated into her chest and left arm.Pt has associated nausea that has since resolved. Denies SOB. Pt took aspirin 81mg  PTA.

## 2018-05-25 NOTE — Discharge Instructions (Signed)
You were seen in the emergency department for back and chest pain.  You had blood work EKG and chest x-ray did not show an obvious cause of your symptoms.  Will be important to take a baby aspirin daily and to make a follow-up appointment with your primary care doctor.  Please return if any worsening symptoms.

## 2018-05-25 NOTE — ED Notes (Signed)
ED Provider at bedside. 

## 2018-05-25 NOTE — ED Provider Notes (Signed)
[facility] EMERGENCY DEPARTMENT Provider Note   CSN: [id number] Arrival date & time: 05/25/18  2017    History   Chief Complaint Chief Complaint  Patient presents with  . Chest Pain    HPI LISAANN ATHA is a 53 y.o. female.  HPI: A 53 year old patient presents for evaluation of chest pain. Initial onset of pain was approximately 1-3 hours ago. The patient's chest pain is not worse with exertion. The patient complains of nausea. The patient's chest pain is middle- or left-sided, is not well-localized, is not described as heaviness/pressure/tightness, is not sharp and does radiate to the arms/jaw/neck. The patient denies diaphoresis. The patient has no history of stroke, has no history of peripheral artery disease, has not smoked in the past 90 days, denies any history of treated diabetes, has no relevant family history of coronary artery disease (first degree relative at less than age 80), is not hypertensive, has no history of hypercholesterolemia and does not have an elevated BMI (>=30).   She initially said she started with some mid back pain which then radiated into her chest into her left arm.  Was associated with some nausea but no vomiting.  No shortness of breath or diaphoresis no dizziness.  She took an aspirin and she said the pain is now barely noticeable.  No recent illness or sick contacts.  No cough or fever no shortness of breath no urinary symptoms.  No trauma.  No known history of cardiac disease,  father had heart disease in his 29s. The history is provided by the patient.  Chest Pain  Pain location:  L chest Pain quality: pressure   Pain radiates to:  L arm Pain severity:  Mild Onset quality:  Gradual Duration:  2 hours Timing:  Constant Progression:  Improving Chronicity:  New Context: at rest   Relieved by:  Aspirin Worsened by:  Nothing Ineffective treatments:  None tried Associated symptoms: back pain and nausea   Associated symptoms: no  abdominal pain, no fever, no heartburn, no lower extremity edema, no palpitations, no shortness of breath, no syncope and no vomiting   Risk factors: no coronary artery disease, no diabetes mellitus, no high cholesterol, no hypertension and no smoking     Past Medical History:  Diagnosis Date  . Thyroid disease     Patient Active Problem List   Diagnosis Date Noted  . Muscle twitching 07/30/2016  . Abdominal cramping 11/21/2012  . Perimenopause 10/03/2012  . Hypothyroidism 10/03/2012  . Diarrhea 10/03/2012  . Abdominal pain 10/03/2012  . 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 dnc    . KNEE SURGERY  left  . TONSILLECTOMY  1973     OB History    Gravida  3   Para      Term      Preterm      AB  1   Living  2     SAB  1   TAB      Ectopic      Multiple      Live Births               Home Medications    Prior to Admission medications   Medication Sig Start Date End Date Taking? Authorizing Provider  Cholecalciferol (VITAMIN D) 2000 UNITS CAPS Take 2,000 Units by mouth 3 (three) times a week.     [provider]  cycloSPORINE (RESTASIS) 0.05 % ophthalmic  emulsion Place 1 drop into both eyes 2 (two) times daily.    [provider]  JUNEL 1/20 1-20 MG-MCG tablet TAKE 1 TABLET BY MOUTH EVERY DAY CONTINUOUSLY.4PACKS FOR 84 DAYS 06/24/16   [provider]  levothyroxine (SYNTHROID, LEVOTHROID) 100 MCG tablet Take 100 mcg by mouth daily.    [provider]  LINZESS 145 MCG CAPS capsule TAKE 1 CAPSULE (145 MCG) BY MOUTH DAILY ON AN EMPTY STOMACH BEFORE FIRST MEAL OF THE DAY 05/06/16   [provider]  Probiotic Product (RESTORA PO) Take 1 tablet by mouth daily.     [provider]  TURMERIC PO Take 1 tablet by mouth daily.    [provider]  UNABLE TO FIND Take 2 mg by mouth daily. Med Name: Resolor 2mg     [provider]    Family  History Family History  Problem Relation Age of Onset  . Diabetes Father     Social History Social History   Tobacco Use  . Smoking status: Never Smoker  . Smokeless tobacco: Never Used  Substance Use Topics  . Alcohol use: No  . Drug use: No     Allergies   Sulfa antibiotics   Review of Systems Review of Systems  Constitutional: Negative for fever.  HENT: Negative for sore throat.   Eyes: Negative for visual disturbance.  Respiratory: Negative for shortness of breath.   Cardiovascular: Positive for chest pain. Negative for palpitations and syncope.  Gastrointestinal: Positive for nausea. Negative for abdominal pain, heartburn and vomiting.  Genitourinary: Negative for dysuria and hematuria.  Musculoskeletal: Positive for back pain.  Skin: Negative for rash.  Neurological: Negative for syncope.  All other systems reviewed and are negative.    Physical Exam Updated Vital Signs BP (!) 150/85 (BP Location: Right Arm)   Pulse 85   Resp 17   Ht 5\' 5"  (1.651 m)   Wt 72.6 kg   LMP 03/05/2010   SpO2 99%   BMI 26.63 kg/m   Physical Exam Vitals signs and nursing note reviewed.  Constitutional:      General: She is not in acute distress.    Appearance: She is well-developed.  HENT:     Head: Normocephalic and atraumatic.  Eyes:     Conjunctiva/sclera: Conjunctivae normal.  Neck:     Musculoskeletal: Neck supple.  Cardiovascular:     Rate and Rhythm: Normal rate and regular rhythm.     Heart sounds: Normal heart sounds. No murmur.  Pulmonary:     Effort: Pulmonary effort is normal. No respiratory distress.     Breath sounds: Normal breath sounds.  Abdominal:     Palpations: Abdomen is soft. There is no mass.     Tenderness: There is no abdominal tenderness. There is no guarding.  Musculoskeletal: Normal range of motion.     Right lower leg: She exhibits no tenderness. No edema.     Left lower leg: She exhibits no tenderness. No edema.  Skin:    General:  Skin is warm and dry.     Capillary Refill: Capillary refill takes less than 2 seconds.  Neurological:     General: No focal deficit present.     Mental Status: She is alert and oriented to person, place, and time.      ED Treatments / Results  Labs (all labs ordered are listed, but only abnormal results are displayed) Labs Reviewed  BASIC METABOLIC PANEL - Abnormal; Notable for the following components:  Result Value   Glucose, Bld 103 (*)    Creatinine, Ser 1.08 (*)    Calcium 8.8 (*)    GFR calc non Af Amer 59 (*)    All other components within normal limits  CBC  TROPONIN I  TROPONIN I    EKG EKG Interpretation  Date/Time:  Wednesday May 25 2018 20:25:45 EDT Ventricular Rate:  81 PR Interval:    QRS Duration: 88 QT Interval:  389 QTC Calculation: 452 R Axis:   13 Text Interpretation:  Sinus rhythm similar to prior 12/16 Confirmed by Meridee ScoreButler, Madesyn Ast [phone]([id number]) on 05/25/2018 8:40:55 PM   Radiology Dg Chest 2 View  Result Date: 05/25/2018 CLINICAL DATA:  Back pain EXAM: CHEST - 2 VIEW COMPARISON:  01/29/2011 FINDINGS: The heart size and mediastinal contours are within normal limits. Both lungs are clear. The visualized skeletal structures are unremarkable. IMPRESSION: No active cardiopulmonary disease. Electronically Signed   By: Alcide CleverMark  Lukens M.D.   On: 05/25/2018 20:47    Procedures Procedures (including critical care time)  Medications Ordered in ED Medications - No data to display   Initial Impression / Assessment and Plan / ED Course  I have reviewed the triage vital signs and the nursing notes.  Pertinent labs & imaging results that were available during my care of the patient were reviewed by me and considered in my medical decision making (see chart for details).  Clinical Course as of May 26 935  Wed May 25, 2018  [id number] 53 year old female with no significant cardiac history here with back and chest pain radiating to left arm with associated nausea.   Her vitals are unremarkable along with her physical exam.  She says her pain is mostly resolved now.  Her EKG is benign.  Differential diagnosis includes ACS, pneumonia, pneumothorax, aortic disease, reflux, musculoskeletal.   [MB]  [id number] 53 year old female with no known cardiac disease here with chest pain that is mostly resolved now.  Chest x-ray EKG troponin all unremarkable.  Due to the time of her pain will get a delta Trope.   [MB]    Clinical Course User Index [MB] Terrilee FilesButler, Norely Schlick C, MD    HEAR Score: 2  Patient signed out to Dr Preston FleetingGlick with repeat troponin pending. Likely can be discharged to followup with pcp if unchanged.   Final Clinical Impressions(s) / ED Diagnoses   Final diagnoses:  Nonspecific chest pain    ED Discharge Orders    None       Terrilee FilesButler, Briellah Baik C, MD 05/26/18 [phone]

## 2018-05-26 LAB — TROPONIN I: Troponin I: <0.03 ng/mL (ref <0.03)

## 2018-05-26 NOTE — ED Provider Notes (Signed)
Care assumed from Dr. Charm Barges, patient with chest pain and negative work-up pending delta troponin.  Repeat troponin is normal.  She has not had any further symptoms while in the emergency department.  She is discharged with instructions to follow-up with her PCP.   Dione Booze, MD 05/26/18 [phone]

## 2020-04-05 IMAGING — CR CHEST - 2 VIEW
2 series · 2 of 2 positions shown · non-contrast
Comparison: 01/29/2011

CLINICAL DATA: Back pain

EXAM:
CHEST - 2 VIEW

[w chest pa]
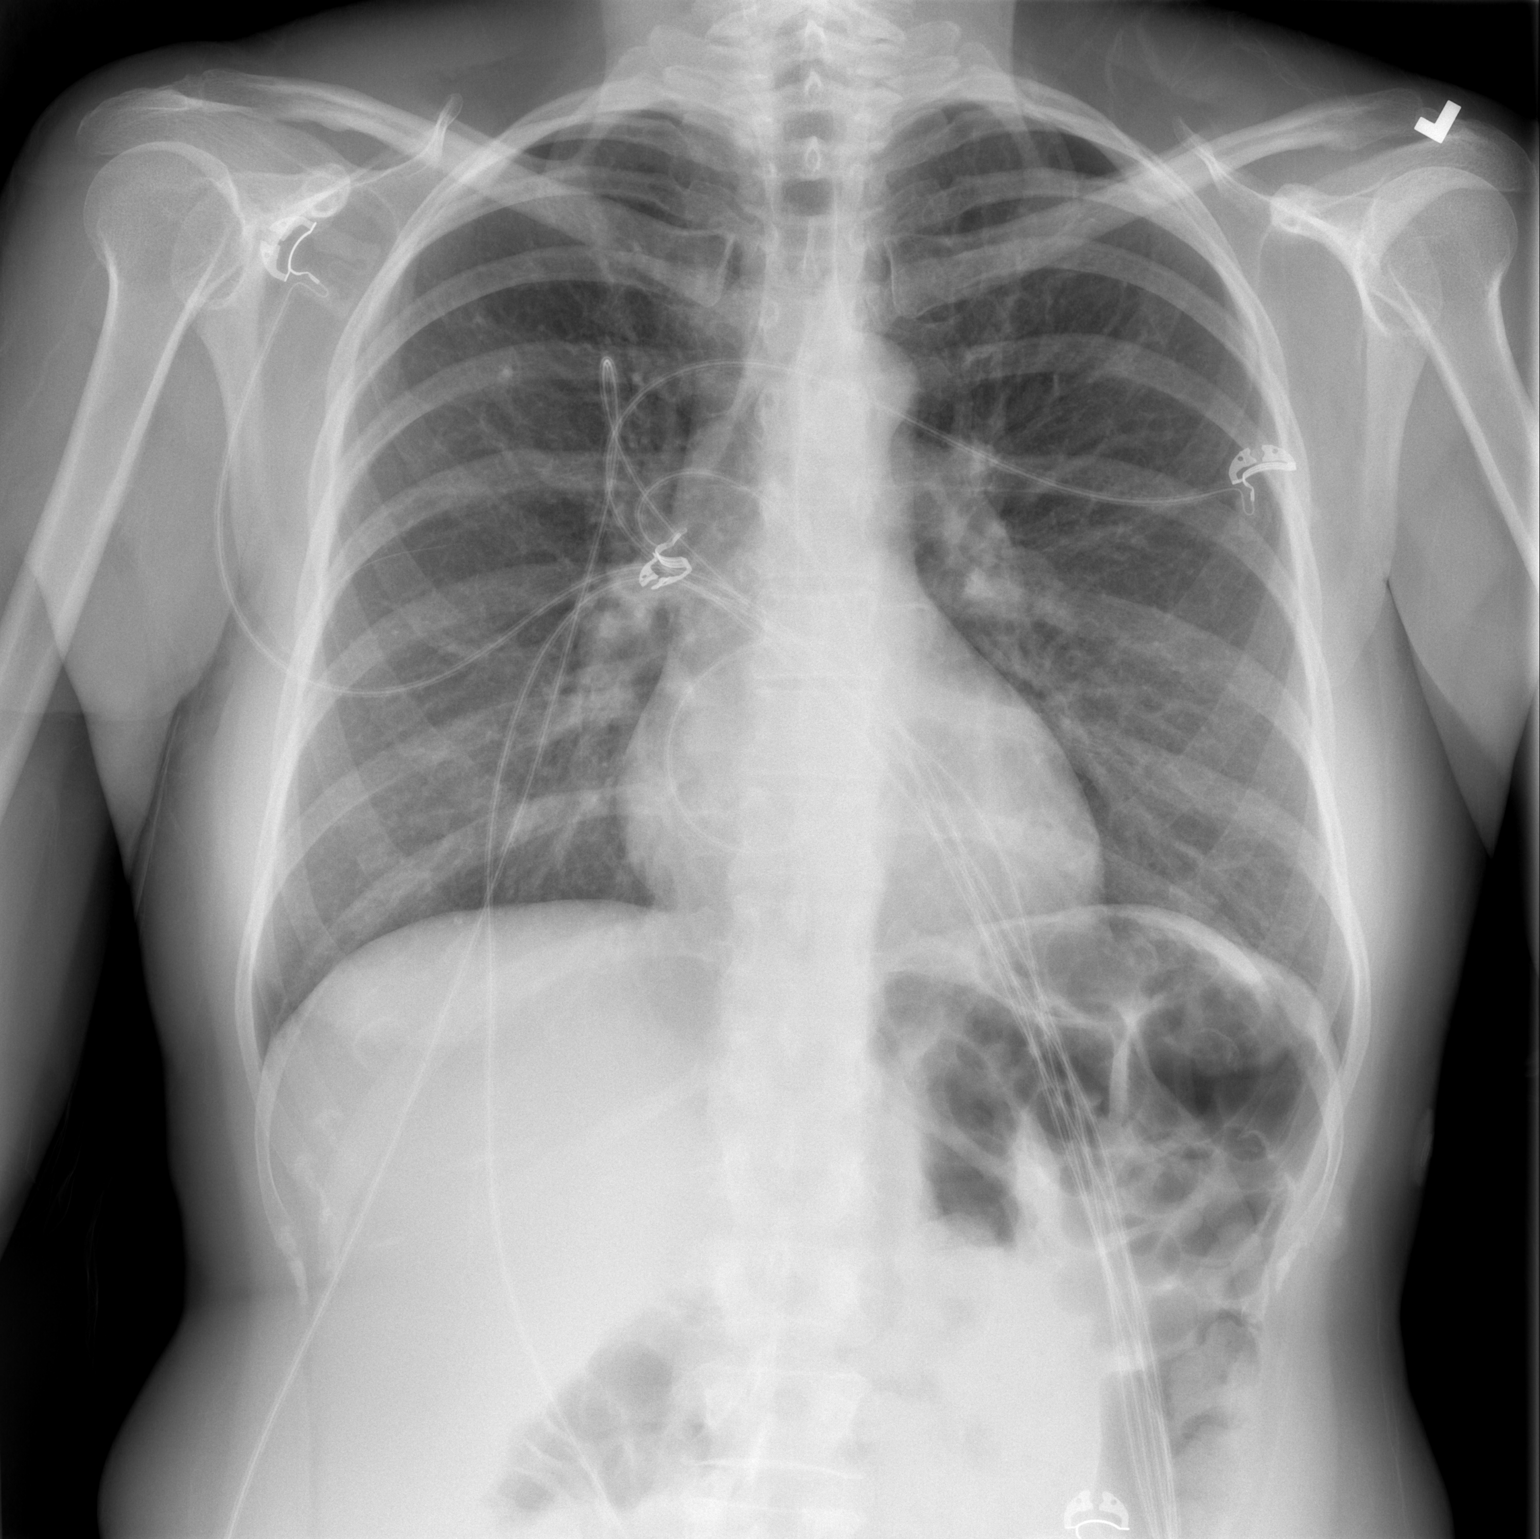

[w chest lat]
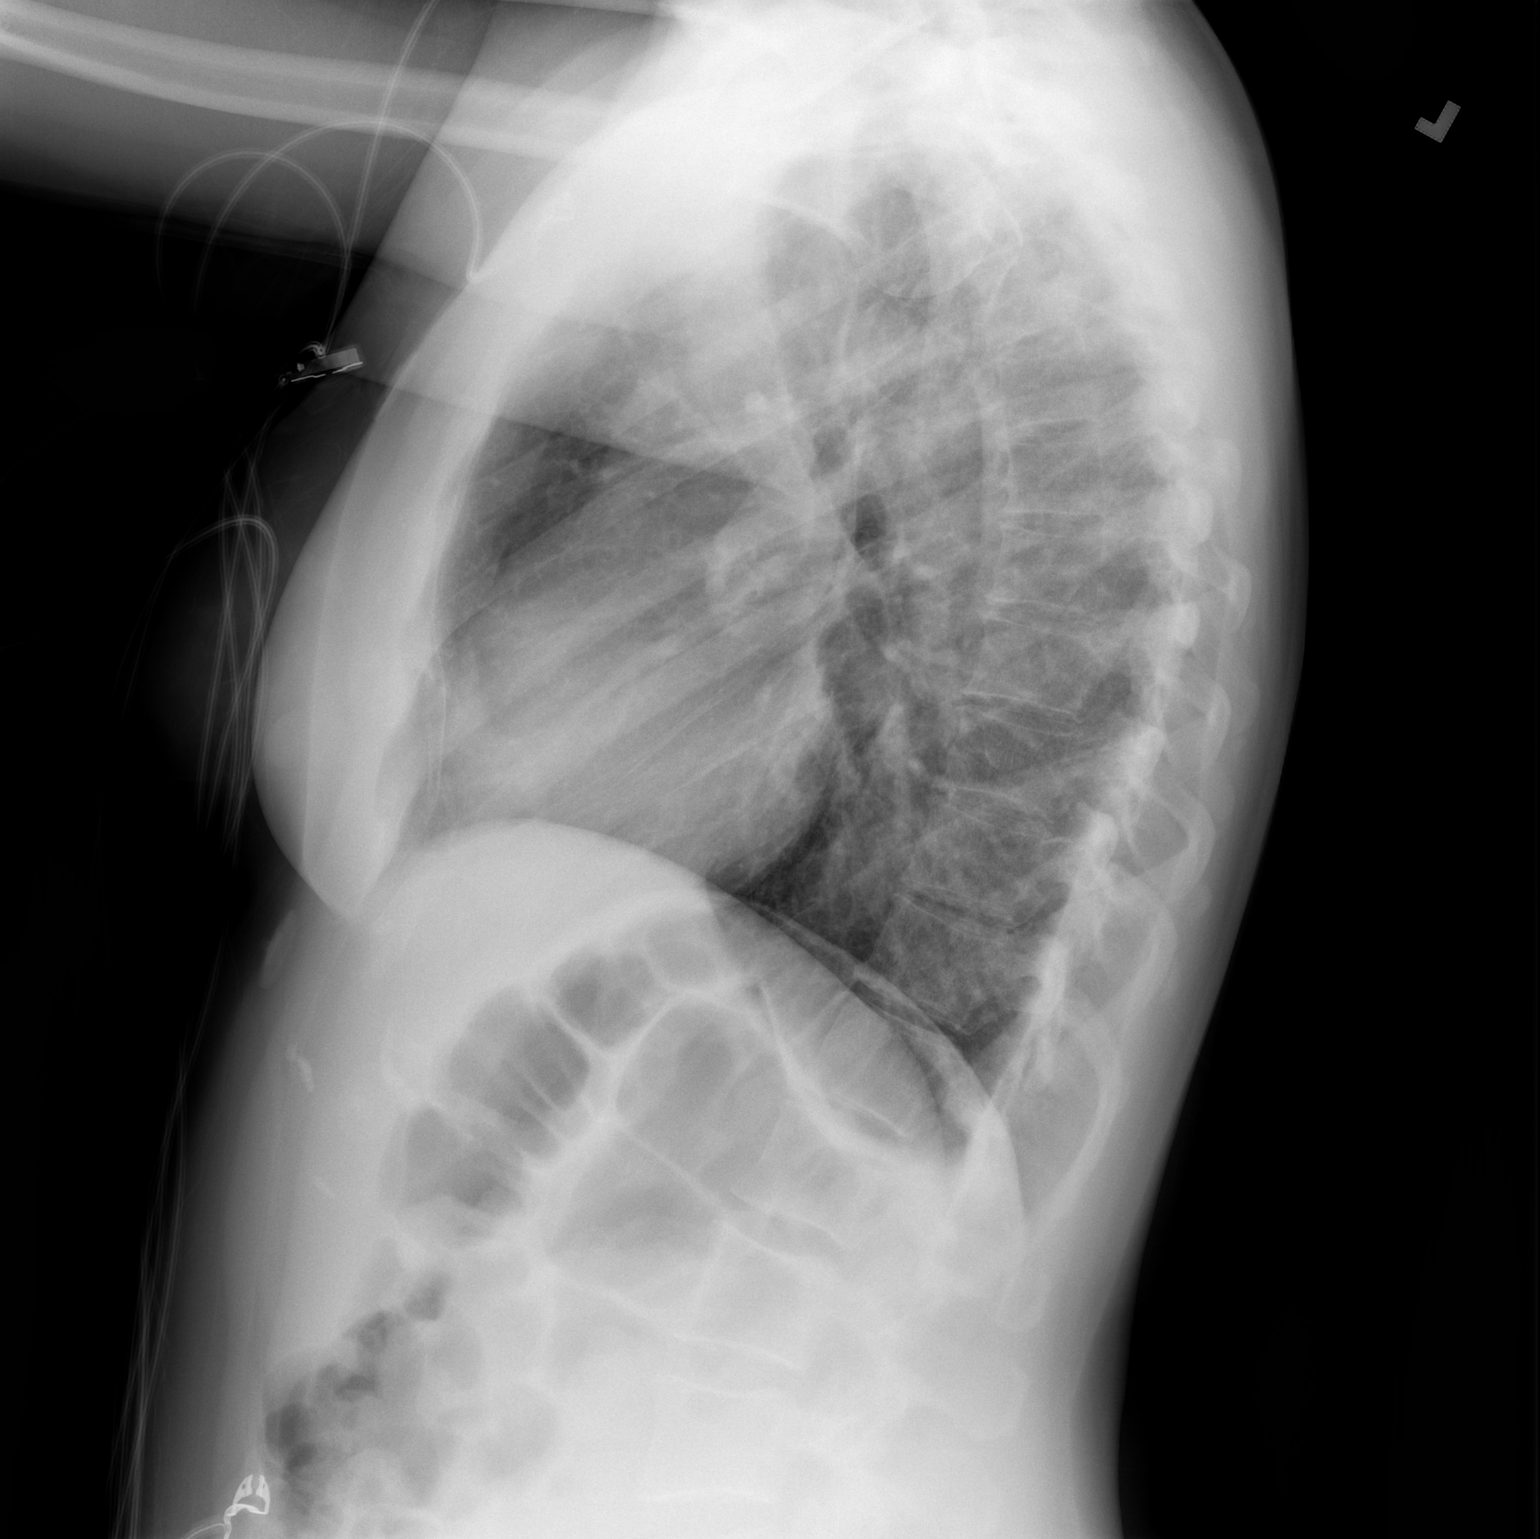

[2 of 2 positions shown; findings below may reference images not displayed]

FINDINGS: The heart size and mediastinal contours are within normal limits.
Both lungs are clear. The visualized skeletal structures are
unremarkable.
IMPRESSION: No active cardiopulmonary disease.

## 2020-07-07 ENCOUNTER — Inpatient Hospital Stay (HOSPITAL_BASED_OUTPATIENT_CLINIC_OR_DEPARTMENT_OTHER)
Admission: EM | Admit: 2020-07-07 | Discharge: 2020-07-14 | DRG: 330 | Disposition: A | Discharge status: Home / Self Care | Payer: 59 | Attending: Family Medicine | Admitting: Family Medicine

## 2020-07-07 ENCOUNTER — Other Ambulatory Visit: Payer: Self-pay

## 2020-07-07 ENCOUNTER — Encounter (HOSPITAL_BASED_OUTPATIENT_CLINIC_OR_DEPARTMENT_OTHER): Payer: Self-pay

## 2020-07-07 ENCOUNTER — Inpatient Hospital Stay (HOSPITAL_COMMUNITY)
Admission: RE | Admit: 2020-07-07 | Discharge: 2020-07-07 | Discharge status: Absent without leave | Payer: Self-pay | Source: Other Acute Inpatient Hospital

## 2020-07-07 DIAGNOSIS — K562 Volvulus: Principal | ICD-10-CM | POA: Diagnosis present

## 2020-07-07 DIAGNOSIS — K5939 Other megacolon: Secondary | ICD-10-CM | POA: Diagnosis present

## 2020-07-07 DIAGNOSIS — Z20822 Contact with and (suspected) exposure to covid-19: Secondary | ICD-10-CM | POA: Diagnosis present

## 2020-07-07 DIAGNOSIS — Z7951 Long term (current) use of inhaled steroids: Secondary | ICD-10-CM

## 2020-07-07 DIAGNOSIS — E876 Hypokalemia: Secondary | ICD-10-CM | POA: Diagnosis present

## 2020-07-07 DIAGNOSIS — M25512 Pain in left shoulder: Secondary | ICD-10-CM | POA: Diagnosis not present

## 2020-07-07 DIAGNOSIS — Z5331 Laparoscopic surgical procedure converted to open procedure: Secondary | ICD-10-CM

## 2020-07-07 DIAGNOSIS — K5909 Other constipation: Secondary | ICD-10-CM

## 2020-07-07 DIAGNOSIS — Z79899 Other long term (current) drug therapy: Secondary | ICD-10-CM

## 2020-07-07 DIAGNOSIS — K589 Irritable bowel syndrome without diarrhea: Secondary | ICD-10-CM

## 2020-07-07 DIAGNOSIS — K581 Irritable bowel syndrome with constipation: Secondary | ICD-10-CM | POA: Diagnosis present

## 2020-07-07 DIAGNOSIS — Z833 Family history of diabetes mellitus: Secondary | ICD-10-CM

## 2020-07-07 DIAGNOSIS — E039 Hypothyroidism, unspecified: Secondary | ICD-10-CM | POA: Diagnosis present

## 2020-07-07 HISTORY — DX: Irritable bowel syndrome, unspecified: K58.9

## 2020-07-07 HISTORY — DX: Other specified functional intestinal disorders: K59.89

## 2020-07-07 NOTE — ED Triage Notes (Signed)
Pt presents with 2 day hx of abd pain. Pt with IBS-C and pseudo-obstruction. States her last BM was 6/16. Pt denies vomiting.

## 2020-07-08 ENCOUNTER — Inpatient Hospital Stay (HOSPITAL_COMMUNITY): Payer: 59 | Admitting: Certified Registered Nurse Anesthetist

## 2020-07-08 ENCOUNTER — Other Ambulatory Visit: Payer: Self-pay

## 2020-07-08 ENCOUNTER — Emergency Department (HOSPITAL_BASED_OUTPATIENT_CLINIC_OR_DEPARTMENT_OTHER): Payer: 59

## 2020-07-08 ENCOUNTER — Encounter (HOSPITAL_BASED_OUTPATIENT_CLINIC_OR_DEPARTMENT_OTHER): Payer: Self-pay | Admitting: Radiology

## 2020-07-08 ENCOUNTER — Inpatient Hospital Stay (HOSPITAL_COMMUNITY): Payer: 59

## 2020-07-08 ENCOUNTER — Encounter (HOSPITAL_COMMUNITY)
Admission: EM | Disposition: A | Discharge status: Home / Self Care | Payer: Self-pay | Source: Home / Self Care | Attending: Family Medicine

## 2020-07-08 ENCOUNTER — Encounter (HOSPITAL_COMMUNITY): Admission: RE | Discharge status: Absent without leave | Payer: Self-pay | Source: Other Acute Inpatient Hospital

## 2020-07-08 ENCOUNTER — Inpatient Hospital Stay (HOSPITAL_COMMUNITY): Payer: Self-pay | Admitting: Certified Registered Nurse Anesthetist

## 2020-07-08 DIAGNOSIS — K5909 Other constipation: Secondary | ICD-10-CM

## 2020-07-08 DIAGNOSIS — K589 Irritable bowel syndrome without diarrhea: Secondary | ICD-10-CM

## 2020-07-08 DIAGNOSIS — E039 Hypothyroidism, unspecified: Secondary | ICD-10-CM | POA: Diagnosis not present

## 2020-07-08 DIAGNOSIS — K581 Irritable bowel syndrome with constipation: Secondary | ICD-10-CM | POA: Diagnosis not present

## 2020-07-08 DIAGNOSIS — M25512 Pain in left shoulder: Secondary | ICD-10-CM | POA: Diagnosis not present

## 2020-07-08 DIAGNOSIS — N182 Chronic kidney disease, stage 2 (mild): Secondary | ICD-10-CM | POA: Insufficient documentation

## 2020-07-08 DIAGNOSIS — K219 Gastro-esophageal reflux disease without esophagitis: Secondary | ICD-10-CM | POA: Insufficient documentation

## 2020-07-08 DIAGNOSIS — Z20822 Contact with and (suspected) exposure to covid-19: Secondary | ICD-10-CM | POA: Diagnosis not present

## 2020-07-08 DIAGNOSIS — Z833 Family history of diabetes mellitus: Secondary | ICD-10-CM | POA: Diagnosis not present

## 2020-07-08 DIAGNOSIS — K5939 Other megacolon: Secondary | ICD-10-CM | POA: Diagnosis not present

## 2020-07-08 DIAGNOSIS — K562 Volvulus: Secondary | ICD-10-CM | POA: Diagnosis present

## 2020-07-08 DIAGNOSIS — E876 Hypokalemia: Secondary | ICD-10-CM | POA: Diagnosis not present

## 2020-07-08 DIAGNOSIS — Z79899 Other long term (current) drug therapy: Secondary | ICD-10-CM | POA: Diagnosis not present

## 2020-07-08 DIAGNOSIS — Z7951 Long term (current) use of inhaled steroids: Secondary | ICD-10-CM | POA: Diagnosis not present

## 2020-07-08 DIAGNOSIS — H8110 Benign paroxysmal vertigo, unspecified ear: Secondary | ICD-10-CM | POA: Insufficient documentation

## 2020-07-08 DIAGNOSIS — Z5331 Laparoscopic surgical procedure converted to open procedure: Secondary | ICD-10-CM | POA: Diagnosis not present

## 2020-07-08 HISTORY — PX: COLONOSCOPY: SHX5424

## 2020-07-08 LAB — COMPREHENSIVE METABOLIC PANEL
ALT: 19 U/L (ref 0–44)
AST: 22 U/L (ref 15–41)
Albumin: 3.9 g/dL (ref 3.5–5.0)
Alkaline Phosphatase: 67 U/L (ref 38–126)
Anion gap: 8 (ref 5–15)
BUN: 13 mg/dL (ref 6–20)
CO2: 25 mmol/L (ref 22–32)
Calcium: 8.5 mg/dL — ABNORMAL LOW (ref 8.9–10.3)
Chloride: 107 mmol/L (ref 98–111)
Creatinine, Ser: 1.01 mg/dL — ABNORMAL HIGH (ref 0.44–1.00)
GFR, Estimated: >60 mL/min (ref >60)
Glucose, Bld: 105 mg/dL — ABNORMAL HIGH (ref 70–99)
Potassium: 3.2 mmol/L — ABNORMAL LOW (ref 3.5–5.1)
Sodium: 140 mmol/L (ref 135–145)
Total Bilirubin: 0.7 mg/dL (ref 0.3–1.2)
Total Protein: 7.6 g/dL (ref 6.5–8.1)

## 2020-07-08 LAB — RESP PANEL BY RT-PCR (FLU A&B, COVID) ARPGX2
Influenza A by PCR: NEGATIVE
Influenza B by PCR: NEGATIVE
SARS Coronavirus 2 by RT PCR: NEGATIVE

## 2020-07-08 LAB — MAGNESIUM: Magnesium: 1.9 mg/dL (ref 1.7–2.4)

## 2020-07-08 LAB — ABO/RH: ABO/RH(D): A POS

## 2020-07-08 LAB — CBC WITH DIFFERENTIAL/PLATELET
Abs Immature Granulocytes: 0.02 10*3/uL (ref 0.00–0.07)
Basophils Absolute: 0 10*3/uL (ref 0.0–0.1)
Basophils Relative: 0 %
Eosinophils Absolute: 0.1 10*3/uL (ref 0.0–0.5)
Eosinophils Relative: 0 %
HCT: 40.2 % (ref 36.0–46.0)
Hemoglobin: 13.2 g/dL (ref 12.0–15.0)
Immature Granulocytes: 0 %
Lymphocytes Relative: 15 %
Lymphs Abs: 1.7 10*3/uL (ref 0.7–4.0)
MCH: 31.1 pg (ref 26.0–34.0)
MCHC: 32.8 g/dL (ref 30.0–36.0)
MCV: 94.6 fL (ref 80.0–100.0)
Monocytes Absolute: 0.5 10*3/uL (ref 0.1–1.0)
Monocytes Relative: 4 %
Neutro Abs: 9.1 10*3/uL — ABNORMAL HIGH (ref 1.7–7.7)
Neutrophils Relative %: 81 %
Platelets: 349 10*3/uL (ref 150–400)
RBC: 4.25 MIL/uL (ref 3.87–5.11)
RDW: 13.3 % (ref 11.5–15.5)
WBC: 11.3 10*3/uL — ABNORMAL HIGH (ref 4.0–10.5)
nRBC: 0 % (ref 0.0–0.2)

## 2020-07-08 LAB — TYPE AND SCREEN
ABO/RH(D): A POS
Antibody Screen: NEGATIVE

## 2020-07-08 LAB — LIPASE, BLOOD: Lipase: 27 U/L (ref 11–51)

## 2020-07-08 SURGERY — COLONOSCOPY
Anesthesia: Monitor Anesthesia Care

## 2020-07-08 SURGERY — CANCELLED PROCEDURE
Anesthesia: Monitor Anesthesia Care

## 2020-07-08 MED ORDER — NORETHINDRONE ACET-ETHINYL EST 1-20 MG-MCG PO TABS
1.0000 | ORAL_TABLET | Freq: Every day | ORAL | Status: DC
  Administered 2020-07-09 – 2020-07-11 (×2): 1 via ORAL

## 2020-07-08 MED ORDER — TURMERIC 500 MG PO CAPS: ORAL_CAPSULE | Freq: Every day | ORAL | Status: DC

## 2020-07-08 MED ORDER — PROPOFOL 500 MG/50ML IV EMUL
INTRAVENOUS | Status: DC | PRN
  Administered 2020-07-08: 125 ug/kg/min via INTRAVENOUS

## 2020-07-08 MED ORDER — PROPOFOL 10 MG/ML IV BOLUS
INTRAVENOUS | Status: AC
  Filled 2020-07-08: qty 20

## 2020-07-08 MED ORDER — KCL IN DEXTROSE-NACL 20-5-0.45 MEQ/L-%-% IV SOLN
INTRAVENOUS | Status: DC
  Filled 2020-07-08 (×6): qty 1000

## 2020-07-08 MED ORDER — CYCLOSPORINE 0.05 % OP EMUL
1.0000 [drp] | Freq: Two times a day (BID) | OPHTHALMIC | Status: DC
  Administered 2020-07-08 – 2020-07-14 (×12): 1 [drp] via OPHTHALMIC
  Filled 2020-07-08 (×12): qty 1

## 2020-07-08 MED ORDER — LACTATED RINGERS IV SOLN: INTRAVENOUS | Status: DC | PRN

## 2020-07-08 MED ORDER — SODIUM CHLORIDE 0.9 % IV BOLUS
1000.0000 mL | Freq: Once | INTRAVENOUS | Status: AC
  Administered 2020-07-08: 1000 mL via INTRAVENOUS

## 2020-07-08 MED ORDER — VITAMIN D 50 MCG (2000 UT) PO CAPS: 2000.0000 [IU] | ORAL_CAPSULE | ORAL | Status: DC

## 2020-07-08 MED ORDER — PROPOFOL 500 MG/50ML IV EMUL
INTRAVENOUS | Status: DC | PRN
  Administered 2020-07-08: 50 ug via INTRAVENOUS

## 2020-07-08 MED ORDER — LEVOTHYROXINE SODIUM 100 MCG PO TABS
100.0000 ug | ORAL_TABLET | Freq: Every day | ORAL | Status: DC
  Administered 2020-07-08 – 2020-07-14 (×7): 100 ug via ORAL
  Filled 2020-07-08 (×7): qty 1

## 2020-07-08 MED ORDER — POLYETHYLENE GLYCOL 3350 17 G PO PACK
17.0000 g | PACK | Freq: Once | ORAL | Status: AC
  Administered 2020-07-08: 17 g via ORAL
  Filled 2020-07-08: qty 1

## 2020-07-08 MED ORDER — ONDANSETRON HCL 4 MG/2ML IJ SOLN
INTRAMUSCULAR | Status: DC | PRN
  Administered 2020-07-08: 4 mg via INTRAVENOUS

## 2020-07-08 MED ORDER — SODIUM CHLORIDE 0.9 % IV SOLN: INTRAVENOUS | Status: DC | PRN

## 2020-07-08 MED ORDER — ONDANSETRON HCL 4 MG/2ML IJ SOLN
4.0000 mg | Freq: Once | INTRAMUSCULAR | Status: AC
  Administered 2020-07-08: 4 mg via INTRAVENOUS

## 2020-07-08 MED ORDER — HYDROMORPHONE HCL 1 MG/ML IJ SOLN
0.5000 mg | Freq: Once | INTRAMUSCULAR | Status: AC
Start: 2020-07-08 — End: 2020-07-08
  Administered 2020-07-08: 0.5 mg via INTRAVENOUS
  Filled 2020-07-08: qty 1

## 2020-07-08 MED ORDER — KETOROLAC TROMETHAMINE 15 MG/ML IJ SOLN
15.0000 mg | Freq: Once | INTRAMUSCULAR | Status: AC
  Administered 2020-07-08: 15 mg via INTRAVENOUS
  Filled 2020-07-08: qty 1

## 2020-07-08 MED ORDER — HYDROMORPHONE HCL 1 MG/ML IJ SOLN
0.5000 mg | Freq: Once | INTRAMUSCULAR | Status: AC
  Administered 2020-07-08: 0.5 mg via INTRAVENOUS
  Filled 2020-07-08: qty 1

## 2020-07-08 MED ORDER — FENTANYL CITRATE (PF) 100 MCG/2ML IJ SOLN
50.0000 ug | Freq: Once | INTRAMUSCULAR | Status: AC
  Administered 2020-07-08: 50 ug via INTRAVENOUS
  Filled 2020-07-08: qty 2

## 2020-07-08 MED ORDER — IOHEXOL 300 MG/ML  SOLN
100.0000 mL | Freq: Once | INTRAMUSCULAR | Status: AC | PRN
  Administered 2020-07-08: 100 mL via INTRAVENOUS

## 2020-07-08 NOTE — Anesthesia Preprocedure Evaluation (Deleted)
Anesthesia Evaluation  Patient identified by MRN, date of birth, ID band Patient awake    Reviewed: Allergy & Precautions, NPO status , Patient's Chart, lab work & pertinent test results  Airway Mallampati: II  TM Distance: >3 FB Neck ROM: Full    Dental no notable dental hx. 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  Reproductive/Obstetrics                            Anesthesia Physical Anesthesia Plan  ASA: 2 and emergent  Anesthesia Plan: MAC   Post-op Pain Management:    Induction: Intravenous  PONV Risk Score and Plan: 2 and Propofol infusion and Treatment may vary due to age or medical condition  Airway Management Planned: Natural Airway  Additional Equipment:   Intra-op Plan:   Post-operative Plan:   Informed Consent: I have reviewed the patients History and Physical, chart, labs and discussed the procedure including the risks, benefits and alternatives for the proposed anesthesia with the patient or authorized representative who has indicated his/her understanding and acceptance.     Dental advisory given  Plan Discussed with: CRNA  Anesthesia Plan Comments:         Anesthesia Quick Evaluation

## 2020-07-08 NOTE — Consult Note (Signed)
CC: lower abdominal pain  Requesting provider: dr Ashok Pall  HPI: Crystal Mason is an 55 y.o. female who is here for because of worsening lower abdominal pain and cramping.  She started having lower abdominal pain and some cramping on Friday and her symptoms progressively worsened over the weekend.  She does have a history of chronic constipation/IBS with constipation features and takes Trulance and Motegrity on a daily basis.  She still have a bowel movement daily.  Her last bowel movement was on Thursday.  When she became more and more bloated and had worsening worsening pain she presented to [facility] and was evaluated.  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  Her labs were essentially normal except for very mild leukocytosis of 11 and mild hypokalemia.  She had a remote history of colonoscopy from 2013 and random colonic biopsies which were reportedly negative.  She has a remote history of CT enterography that was reported as normal.  She was taken by gastroenterology to endoscopy this morning and underwent colonoscopy and decompression was performed but there is a large amount of stool.  Post procedure plain film revealed improved bowel gas pattern with some residual distention of the sigmoid and splenic flexure but upstream colon is now largely normal.  States that she feels much better.  Her husband is at the bedside.  She denies any current abdominal pain.  Past Medical History:  Diagnosis Date   Chronic intestinal pseudo-obstruction    IBS (irritable colon syndrome)    Constipation   Thyroid disease     Past Surgical History:  Procedure Laterality Date   dnc     EYE SURGERY     KNEE SURGERY  left   TONSILLECTOMY  01/20/1971    Family History  Problem Relation Age of Onset   Diabetes Father     Social:  reports that she has never smoked. She has never used smokeless tobacco. She  reports that she does not drink alcohol and does not use drugs.  Allergies:  Allergies  Allergen Reactions   Sulfa Antibiotics Rash    Medications: I have reviewed the patient's current medications.   ROS - all of the below systems have been reviewed with the patient and positives are indicated with bold text General: chills, fever or night sweats Eyes: blurry vision or double vision ENT: epistaxis or sore throat Allergy/Immunology: itchy/watery eyes or nasal congestion Hematologic/Lymphatic: bleeding problems, blood clots or swollen lymph nodes Endocrine: temperature intolerance or unexpected weight changes Breast: new or changing breast lumps or nipple discharge Resp: cough, shortness of breath, or wheezing CV: chest pain or dyspnea on exertion GI: as per HPI GU: dysuria, trouble voiding, or hematuria MSK: joint pain or joint stiffness Neuro: TIA or stroke symptoms Derm: pruritus and skin lesion changes Psych: anxiety and depression  PE Blood pressure 121/71, pulse 73, temperature 98 F (36.7 C), temperature source Oral, resp. rate 16, height  (1.651 m), weight 72.6 kg, last menstrual period 03/05/2010, SpO2 99 %. Constitutional: NAD; conversant; no deformities Eyes: Moist conjunctiva; no lid lag; anicteric; PERRL Neck: Trachea midline; no thyromegaly Lungs: Normal respiratory effort; no tactile fremitus CV: RRR; no palpable thrills; no pitting edema GI: Abd soft, nontender, no guarding; no palpable hepatosplenomegaly MSK: Normal gait; no clubbing/cyanosis Psychiatric: Appropriate affect; alert and oriented x3 Lymphatic: No palpable cervical or axillary lymphadenopathy Skin:no rash/lesions/induration  Results for orders placed or  performed during the hospital encounter of 07/07/20 (from the past 48 hour(s))  CBC with Differential/Platelet     Status: Abnormal   Collection Time: 07/08/20 12:30 AM  Result Value Ref Range   WBC 11.3 (H) 4.0 - 10.5 K/uL   RBC 4.25  3.87 - 5.11 MIL/uL   Hemoglobin 13.2 12.0 - 15.0 g/dL   HCT 16.140.2 09.636.0 - 04.546.0 %   MCV 94.6 80.0 - 100.0 fL   MCH 31.1 26.0 - 34.0 pg   MCHC 32.8 30.0 - 36.0 g/dL   RDW 40.913.3 81.111.5 - 91.415.5 %   Platelets 349 150 - 400 K/uL   nRBC 0.0 0.0 - 0.2 %   Neutrophils Relative % 81 %   Neutro Abs 9.1 (H) 1.7 - 7.7 K/uL   Lymphocytes Relative 15 %   Lymphs Abs 1.7 0.7 - 4.0 K/uL   Monocytes Relative 4 %   Monocytes Absolute 0.5 0.1 - 1.0 K/uL   Eosinophils Relative 0 %   Eosinophils Absolute 0.1 0.0 - 0.5 K/uL   Basophils Relative 0 %   Basophils Absolute 0.0 0.0 - 0.1 K/uL   Immature Granulocytes 0 %   Abs Immature Granulocytes 0.02 0.00 - 0.07 K/uL    Comment: Performed at [facility] [place], [address] [place], [place] [address]  Comprehensive metabolic panel     Status: Abnormal   Collection Time: 07/08/20 12:30 AM  Result Value Ref Range   Sodium 140 135 - 145 mmol/L   Potassium 3.2 (L) 3.5 - 5.1 mmol/L   Chloride 107 98 - 111 mmol/L   CO2 25 22 - 32 mmol/L   Glucose, Bld 105 (H) 70 - 99 mg/dL    Comment: Glucose reference range applies only to samples taken after fasting for at least 8 hours.   BUN 13 6 - 20 mg/dL   Creatinine, Ser 6.211.01 (H) 0.44 - 1.00 mg/dL   Calcium 8.5 (L) 8.9 - 10.3 mg/dL   Total Protein 7.6 6.5 - 8.1 g/dL   Albumin 3.9 3.5 - 5.0 g/dL   AST 22 15 - 41 U/L   ALT 19 0 - 44 U/L   Alkaline Phosphatase 67 38 - 126 U/L   Total Bilirubin 0.7 0.3 - 1.2 mg/dL   GFR, Estimated >30>60 >86>60 mL/min    Comment: (NOTE) Calculated using the CKD-EPI Creatinine Equation (2021)    Anion gap 8 5 - 15    Comment: Performed at [facility] [place], [address] [place], [place] [address]  Lipase, blood     Status: None   Collection Time: 07/08/20 12:30 AM  Result Value Ref Range   Lipase 27 11 - 51 U/L    Comment: Performed at [facility] [place], [address] [place], [place] [address]  Resp Panel by RT-PCR (Flu A&B, Covid) Nasopharyngeal Swab     Status:  None   Collection Time: 07/08/20  3:43 AM   Specimen: Nasopharyngeal Swab; Nasopharyngeal(NP) swabs in vial transport medium  Result Value Ref Range   SARS Coronavirus 2 by RT PCR NEGATIVE NEGATIVE    Comment: (NOTE) SARS-CoV-2 target nucleic acids are NOT DETECTED.  The SARS-CoV-2 RNA is generally detectable in upper respiratory specimens during the acute phase of infection. The lowest concentration of SARS-CoV-2 viral copies this assay can detect is 138 copies/mL. A negative result does not preclude SARS-Cov-2 infection and should not be used as the sole basis for treatment or other patient management decisions.  A negative result may occur with  improper specimen collection/handling, submission of specimen other than nasopharyngeal swab, presence of viral mutation(s) within the areas targeted by this assay, and inadequate number of viral copies(<138 copies/mL). A negative result must be combined with clinical observations, patient history, and epidemiological information. The expected result is Negative.  Fact Sheet for Patients:  BloggerCourse.com  Fact Sheet for Healthcare Providers:  SeriousBroker.it  This test is no t yet approved or cleared by the [place] FDA and  has been authorized for detection and/or diagnosis of SARS-CoV-2 by FDA under an Emergency Use Authorization (EUA). This EUA will remain  in effect (meaning this test can be used) for the duration of the COVID-19 declaration under Section 564(b)(1) of the Act, 21 U.S.C.section 360bbb-3(b)(1), unless the authorization is terminated  or revoked sooner.       Influenza A by PCR NEGATIVE NEGATIVE   Influenza B by PCR NEGATIVE NEGATIVE    Comment: (NOTE) The Xpert Xpress SARS-CoV-2/FLU/RSV plus assay is intended as an aid in the diagnosis of influenza from Nasopharyngeal swab specimens and should not be used as a sole basis for treatment. Nasal washings  and aspirates are unacceptable for Xpert Xpress SARS-CoV-2/FLU/RSV testing.  Fact Sheet for Patients: BloggerCourse.com  Fact Sheet for Healthcare Providers: SeriousBroker.it  This test is not yet approved or cleared by the [place] FDA and has been authorized for detection and/or diagnosis of SARS-CoV-2 by FDA under an Emergency Use Authorization (EUA). This EUA will remain in effect (meaning this test can be used) for the duration of the COVID-19 declaration under Section 564(b)(1) of the Act, 21 U.S.C. section 360bbb-3(b)(1), unless the authorization is terminated or revoked.  Performed at [facility], [address] [place], [place] [address]   Type and screen [facility]     Status: None   Collection Time: 07/08/20  8:51 AM  Result Value Ref Range   ABO/RH(D) A POS    Antibody Screen NEG    Sample Expiration      07/11/2020,2359 Performed at [facility], [address] [place], [place] [address]   ABO/Rh     Status: None   Collection Time: 07/08/20  8:59 AM  Result Value Ref Range   ABO/RH(D)      A POS Performed at [facility], [address] [place], [place] [address]   Magnesium     Status: None   Collection Time: 07/08/20  9:01 AM  Result Value Ref Range   Magnesium 1.9 1.7 - 2.4 mg/dL    Comment: Performed at [facility], [address] [place], [place] [address]    DG Abd 1 View  Result Date: 07/08/2020 CLINICAL DATA:  55 year old female status post endoscopic decompression of sigmoid volvulus. EXAM: ABDOMEN - 1 VIEW COMPARISON:  CT Abdomen and Pelvis 0205 hours. FINDINGS: Portable AP supine view at 0830 hours. Improved bowel gas pattern from 0205 hours today, with residual gas distended redundant sigmoid measuring 8-9 mm diameter. Splenic flexure is still gas distended. But the descending and mid transverse colon  are no longer distended. Stable dystrophic calcification in the left hemipelvis and excreted IV contrast now in the urinary bladder. Negative visible right lung base. No acute osseous abnormality identified. IMPRESSION: Improved bowel gas pattern from 0205 hours today, residual distension of the sigmoid and splenic flexure, but the upstream colon is now largely normal. Electronically Signed   By: Althea Grimmer.D.  On: 07/08/2020 08:59   CT ABDOMEN PELVIS W CONTRAST  Result Date: 07/08/2020 CLINICAL DATA:  Acute nonlocalized abdominal pain. Pt presents with 2 day hx of abd pain. Pt with IBS-C and pseudo-obstruction. States her last BM was 6/16. Pt denies vomiting. no other complaints EXAM: CT ABDOMEN AND PELVIS WITH CONTRAST TECHNIQUE: Multidetector CT imaging of the abdomen and pelvis was performed using the standard protocol following bolus administration of intravenous contrast. CONTRAST:  OMNIPAQUE IOHEXOL 300 MG/ML  SOLN COMPARISON:  CT abdomen pelvis 04/11/2013. FINDINGS: Lower chest: No acute abnormality. Hepatobiliary: No focal liver abnormality. No gallstones, gallbladder wall thickening, or pericholecystic fluid. No biliary dilatation. Pancreas: No focal lesion. Normal pancreatic contour. No surrounding inflammatory changes. No main pancreatic ductal dilatation. Spleen: Normal in size without focal abnormality. Adrenals/Urinary Tract: No adrenal nodule bilaterally. Bilateral kidneys enhance symmetrically. No hydronephrosis. No hydroureter. The urinary bladder is unremarkable. Stomach/Bowel: There is swirling of the mesentery within the left lower abdomen with a double beak sign associated with the sigmoid colon (proximal sigmoid colon transition point on coronal image 49, distal sigmoid colon transition point on coronal image 50). The sigmoid colon is distended with gas measuring up to 8 cm within associated air-fluid level (2:9, 5:69). Stomach is within normal limits. No evidence of small bowel wall  thickening or dilatation. Stool is noted within the transverse colon. The rectum is decompressed. No pneumatosis. Appendix appears normal. Vascular/Lymphatic: No significant vascular findings are present. No enlarged abdominal or pelvic lymph nodes. Reproductive: Uterus and bilateral adnexa are unremarkable. Other: Redemonstration nonspecific left pelvic 2 cm calcification (2:72). Several phleboliths also noted within the pelvis. Trace free fluid within the pelvis. No organized fluid collection. No pneumoperitoneum. Musculoskeletal: No abdominal wall hernia or abnormality. No suspicious lytic or blastic osseous lesions. No acute displaced fracture. IMPRESSION: Findings suggestive of a sigmoid volvulus. These results were called by telephone at the time of interpretation on 07/08/2020 at 3:11 am to provider [facility] , who verbally acknowledged these results. Electronically Signed   By: Tish Frederickson M.D.   On: 07/08/2020 03:12   DG Abdomen Acute W/Chest  Result Date: 07/08/2020 CLINICAL DATA:  2 day hx of abd pain. Pt with IBS-C and pseudo-obstruction. EXAM: DG ABDOMEN ACUTE WITH 1 VIEW CHEST COMPARISON:  x ray abdomen 04/30/2017, CT abdomen pelvis 04/11/2048 FINDINGS: The heart size and mediastinal contours are within normal limits. Elevated left hemidiaphragm. Left base atelectasis. No focal consolidation. No pulmonary edema. No pleural effusion. No pneumothorax. Lead pipe appearance of the left colon. Gaseous dilatation of the large bowel. Stool overlies the pelvis. No evidence of free intraperitoneal air. Similar-appearing chronic 2.9 cm calcific density within the left pelvis. Several phleboliths are noted overlying the pelvis. No acute osseous abnormality. IMPRESSION: 1. Gaseous dilatation of the large bowel with appearance of the left colon consistent with inflammatory bowel disease. If a CT abdomen pelvis is clinically indicated, please obtain with intravenous contrast. 2. Limited evaluation for  free intraperitoneal gas. 3. No acute cardiopulmonary disease. Electronically Signed   By: Tish Frederickson M.D.   On: 07/08/2020 00:57    Imaging: reviewed  A/P: Crystal Mason is an 55 y.o. 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  We discussed sigmoid volvulus.  She was given Agricultural engineer.  She is resting calmly.  Her abdomen is benign.  She is nontoxic-appearing.  Therefore she does not  need urgent surgical intervention.  We did discuss the risk of recurrent sigmoid volvulus is rather high and therefore ultimately she will need sigmoid colectomy.  We have tentatively discussed performing this procedure while she is in hospital.  The goal would be to do a gentle bowel prep and then do sigmoid colectomy in the next few days.  Therefore we could do the procedure in 1 stage as opposed to having to do a diverting end colostomy  I discussed the procedure in detail.  We discussed the risks and benefits of surgery including, but not limited to bleeding, infection (such as wound infection, abdominal abscess), injury to surrounding structures, blood clot formation, urinary retention, incisional hernia, anastomotic stricture, anastomotic leak, anesthesia risks, potential need for temporary ostomy, pulmonary & cardiac complications such as pneumonia &/or heart attack, need for additional procedures, ileus, & prolonged hospitalization.  We discussed the typical postoperative recovery course, including limitations & restrictions postoperatively. I explained that the likelihood of improvement in their symptoms is good.  We will follow along Patient may have clear liquid diet today Ok for Chemical VTE prophylaxis   Mary Sella. Andrey Campanile, MD, FACS General, Bariatric, & Minimally Invasive Surgery [facility], [place]

## 2020-07-08 NOTE — Anesthesia Preprocedure Evaluation (Addendum)
Anesthesia Evaluation  Patient identified by MRN, date of birth, ID band Patient awake    Reviewed: Allergy & Precautions, H&P , NPO status , Patient's Chart, lab work & pertinent test results, reviewed documented beta blocker date and time   Airway Mallampati: II  TM Distance: >3 FB Neck ROM: full    Dental no notable dental hx.    Pulmonary neg pulmonary ROS,    Pulmonary exam normal breath sounds clear to auscultation       Cardiovascular Exercise Tolerance: Good negative cardio ROS   Rhythm:regular Rate:Normal     Neuro/Psych negative neurological ROS  negative psych ROS   GI/Hepatic negative GI ROS, Neg liver ROS,   Endo/Other  Hypothyroidism   Renal/GU negative Renal ROS  negative genitourinary   Musculoskeletal   Abdominal   Peds  Hematology negative hematology ROS (+)   Anesthesia Other Findings   Reproductive/Obstetrics negative OB ROS                             Anesthesia Physical Anesthesia Plan  ASA: 2 and emergent  Anesthesia Plan: MAC   Post-op Pain Management:    Induction: Intravenous  PONV Risk Score and Plan: 2 and Propofol infusion and Treatment may vary due to age or medical condition  Airway Management Planned: Natural Airway  Additional Equipment:   Intra-op Plan:   Post-operative Plan:   Informed Consent: I have reviewed the patients History and Physical, chart, labs and discussed the procedure including the risks, benefits and alternatives for the proposed anesthesia with the patient or authorized representative who has indicated his/her understanding and acceptance.     Dental Advisory Given  Plan Discussed with: CRNA  Anesthesia Plan Comments:        Anesthesia Quick Evaluation

## 2020-07-08 NOTE — Consult Note (Signed)
[facility] Gastroenterology Consult  Referring Provider: No ref. provider found Primary Care Physician:  Georgianne Fick, MD Primary Gastroenterologist: Dr.Outlaw  Reason for Consultation: Sigmoid volvulus  HPI: Crystal Mason is a 55 y.o. female presented to med [facility] with abdominal pain, constipation and nausea.  Patient states that she has not had a bowel movement since Thursday. She has been nauseous but has not had any vomiting. For constipation she takes Chiropodist.  Examination in ER revealed generalized abdominal tenderness, subsequently she had a CAT scan of the abdomen and pelvis with contrast which showed swirling of mesentery within left lower abdomen with a double beak sign associated within the sigmoid, sigmoid colon distended with gas measuring up to 8 cm with associated air-fluid level, no pneumatosis, findings suggestive of sigmoid volvulus.  Labs showed normal CBC, mild hypokalemia creatinine 1.01 with a normal GFR normal LFTs.  Colonoscopy from 2013 performed for diarrhea showed mildly melanotic colonic mucosa. Random colon biopsies were unremarkable, otherwise the colon and terminal ileum appeared normal. CT enterography from 2011 was reported as normal. She was last seen by Dr. Dulce Sellar in the office in 2016.   Past Medical History:  Diagnosis Date   Chronic intestinal pseudo-obstruction    IBS (irritable colon syndrome)    Constipation   Thyroid disease     Past Surgical History:  Procedure Laterality Date   dnc     EYE SURGERY     KNEE SURGERY  left   TONSILLECTOMY  01/20/1971    Prior to Admission medications   Medication Sig Start Date End Date Taking? Authorizing Provider  Cholecalciferol (VITAMIN D) 2000 UNITS CAPS Take 2,000 Units by mouth 3 (three) times a week.     [provider]  cycloSPORINE (RESTASIS) 0.05 % ophthalmic emulsion Place 1 drop into both eyes 2 (two) times daily.    [provider]   JUNEL 1/20 1-20 MG-MCG tablet TAKE 1 TABLET BY MOUTH EVERY DAY CONTINUOUSLY.4PACKS FOR 84 DAYS 06/24/16   [provider]  levothyroxine (SYNTHROID, LEVOTHROID) 100 MCG tablet Take 100 mcg by mouth daily.    [provider]  LINZESS 145 MCG CAPS capsule TAKE 1 CAPSULE (145 MCG) BY MOUTH DAILY ON AN EMPTY STOMACH BEFORE FIRST MEAL OF THE DAY 05/06/16   [provider]  Probiotic Product (RESTORA PO) Take 1 tablet by mouth daily.     [provider]  TURMERIC PO Take 1 tablet by mouth daily.    [provider]  UNABLE TO FIND Take 2 mg by mouth daily. Med Name: Resolor 2mg     [provider]    No current facility-administered medications for this encounter.    Allergies as of 07/07/2020 - Review Complete 07/07/2020  Allergen Reaction Noted   Sulfa antibiotics Rash 01/25/2012    Family History  Problem Relation Age of Onset   Diabetes Father     Social History   Socioeconomic History   Marital status: Married    Spouse name: John   Number of children: 2   Years of education: Masters   Highest education level: Not on file  Occupational History   Occupation: [employer]  Tobacco Use   Smoking status: Never   Smokeless tobacco: Never  Vaping Use   Vaping Use: Never used  Substance and Sexual Activity   Alcohol use: No   Drug use: No   Sexual activity: Yes    Birth control/protection: Post-menopausal  Other Topics Concern   Not on  file  Social History Narrative   Lives with husband, John   Caffeine use: 12oz/day- Coke    No coffee   Right handed   Social Determinants of Corporate investment banker Strain: Not on file  Food Insecurity: Not on file  Transportation Needs: Not on file  Physical Activity: Not on file  Stress: Not on file  Social Connections: Not on file  Intimate Partner Violence: Not on file    Review of Systems: Positive for: GI: Described in detail in HPI.    Gen: Denies any fever, chills, rigors,  night sweats, anorexia, fatigue, weakness, malaise, involuntary weight loss, and sleep disorder CV: Denies chest pain, angina, palpitations, syncope, orthopnea, PND, peripheral edema, and claudication. Resp: Denies dyspnea, cough, sputum, wheezing, coughing up blood. GU : Denies urinary burning, blood in urine, urinary frequency, urinary hesitancy, nocturnal urination, and urinary incontinence. MS: Denies joint pain or swelling.  Denies muscle weakness, cramps, atrophy.  Derm: Denies rash, itching, oral ulcerations, hives, unhealing ulcers.  Psych: Denies depression, anxiety, memory loss, suicidal ideation, hallucinations,  and confusion. Heme: Denies bruising, bleeding, and enlarged lymph nodes. Neuro:  Denies any headaches, dizziness, paresthesias. Endo:   thyroid, denies any problems with DM,adrenal function.  Physical Exam: Vital signs in last 24 hours: Temp:  [98.1 F (36.7 C)] 98.1 F (36.7 C) (06/19 2312) Pulse Rate:  [63-78] 63 (06/20 0400) Resp:  [16-20] 16 (06/20 0400) BP: (131-149)/(81-84) 139/84 (06/20 0400) SpO2:  [97 %-100 %] 97 % (06/20 0400) Weight:  [72.6 kg] 72.6 kg (06/19 2313)    General:   Alert,  Well-developed, well-nourished, pleasant and cooperative in NAD Head:  Normocephalic and atraumatic. Eyes:  Sclera clear, no icterus.   Conjunctiva pink. Ears:  Normal auditory acuity. Nose:  No deformity, discharge,  or lesions. Mouth:  No deformity or lesions.  Oropharynx pink & moist. Neck:  Supple; no masses or thyromegaly. Lungs:  Clear throughout to auscultation.   No wheezes, crackles, or rhonchi. No acute distress. Heart:  Regular rate and rhythm; no murmurs, clicks, rubs,  or gallops. Extremities:  Without clubbing or edema. Neurologic:  Alert and  oriented x4;  grossly normal neurologically. Skin:  Intact without significant lesions or rashes. Psych:  Alert and cooperative. Normal mood and affect. Abdomen: Distended, nontender(patient states she just  received pain medications, otherwise states pain was 8 out of 10), bowel sounds not audible        Lab Results: Recent Labs    07/08/20 0030  WBC 11.3*  HGB 13.2  HCT 40.2  PLT 349   BMET Recent Labs    07/08/20 0030  NA 140  K 3.2*  CL 107  CO2 25  GLUCOSE 105*  BUN 13  CREATININE 1.01*  CALCIUM 8.5*   LFT Recent Labs    07/08/20 0030  PROT 7.6  ALBUMIN 3.9  AST 22  ALT 19  ALKPHOS 67  BILITOT 0.7   PT/INR No results for input(s): LABPROT, INR in the last 72 hours.  Studies/Results: CT ABDOMEN PELVIS W CONTRAST  Result Date: 07/08/2020 CLINICAL DATA:  Acute nonlocalized abdominal pain. Pt presents with 2 day hx of abd pain. Pt with IBS-C and pseudo-obstruction. States her last BM was 6/16. Pt denies vomiting. no other complaints EXAM: CT ABDOMEN AND PELVIS WITH CONTRAST TECHNIQUE: Multidetector CT imaging of the abdomen and pelvis was performed using the standard protocol following bolus administration of intravenous contrast. CONTRAST:  OMNIPAQUE IOHEXOL 300 MG/ML  SOLN COMPARISON:  CT abdomen pelvis 04/11/2013. FINDINGS: Lower chest: No acute abnormality. Hepatobiliary: No focal liver abnormality. No gallstones, gallbladder wall thickening, or pericholecystic fluid. No biliary dilatation. Pancreas: No focal lesion. Normal pancreatic contour. No surrounding inflammatory changes. No main pancreatic ductal dilatation. Spleen: Normal in size without focal abnormality. Adrenals/Urinary Tract: No adrenal nodule bilaterally. Bilateral kidneys enhance symmetrically. No hydronephrosis. No hydroureter. The urinary bladder is unremarkable. Stomach/Bowel: There is swirling of the mesentery within the left lower abdomen with a double beak sign associated with the sigmoid colon (proximal sigmoid colon transition point on coronal image 49, distal sigmoid colon transition point on coronal image 50). The sigmoid colon is distended with gas measuring up to 8 cm within associated  air-fluid level (2:9, 5:69). Stomach is within normal limits. No evidence of small bowel wall thickening or dilatation. Stool is noted within the transverse colon. The rectum is decompressed. No pneumatosis. Appendix appears normal. Vascular/Lymphatic: No significant vascular findings are present. No enlarged abdominal or pelvic lymph nodes. Reproductive: Uterus and bilateral adnexa are unremarkable. Other: Redemonstration nonspecific left pelvic 2 cm calcification (2:72). Several phleboliths also noted within the pelvis. Trace free fluid within the pelvis. No organized fluid collection. No pneumoperitoneum. Musculoskeletal: No abdominal wall hernia or abnormality. No suspicious lytic or blastic osseous lesions. No acute displaced fracture. IMPRESSION: Findings suggestive of a sigmoid volvulus. These results were called by telephone at the time of interpretation on 07/08/2020 at 3:11 am to provider [facility] , who verbally acknowledged these results. Electronically Signed   By: Tish Frederickson M.D.   On: 07/08/2020 03:12   DG Abdomen Acute W/Chest  Result Date: 07/08/2020 CLINICAL DATA:  2 day hx of abd pain. Pt with IBS-C and pseudo-obstruction. EXAM: DG ABDOMEN ACUTE WITH 1 VIEW CHEST COMPARISON:  x ray abdomen 04/30/2017, CT abdomen pelvis 04/11/2048 FINDINGS: The heart size and mediastinal contours are within normal limits. Elevated left hemidiaphragm. Left base atelectasis. No focal consolidation. No pulmonary edema. No pleural effusion. No pneumothorax. Lead pipe appearance of the left colon. Gaseous dilatation of the large bowel. Stool overlies the pelvis. No evidence of free intraperitoneal air. Similar-appearing chronic 2.9 cm calcific density within the left pelvis. Several phleboliths are noted overlying the pelvis. No acute osseous abnormality. IMPRESSION: 1. Gaseous dilatation of the large bowel with appearance of the left colon consistent with inflammatory bowel disease. If a CT abdomen pelvis  is clinically indicated, please obtain with intravenous contrast. 2. Limited evaluation for free intraperitoneal gas. 3. No acute cardiopulmonary disease. Electronically Signed   By: Tish Frederickson M.D.   On: 07/08/2020 00:57    Impression: Acute sigmoid volvulus  Plan: Emergent flexible sigmoidoscopy for decompression. Depending upon procedure, may need surgical evaluation thereafter. The risks and the benefits of the procedure were discussed in details with the patient and her husband at bedside. The understanding verbalized consent.   LOS: 0 days   Kerin Salen, MD  07/08/2020, 5:46 AM

## 2020-07-08 NOTE — Anesthesia Procedure Notes (Signed)
Procedure Name: MAC Date/Time: 07/08/2020 6:43 AM Performed by: Jari Pigg, CRNA Pre-anesthesia Checklist: Patient identified, Emergency Drugs available, Suction available, Patient being monitored and Timeout performed Patient Re-evaluated:Patient Re-evaluated prior to induction Oxygen Delivery Method: Simple face mask Dental Injury: Teeth and Oropharynx as per pre-operative assessment

## 2020-07-08 NOTE — Op Note (Signed)
[facility] Patient Name: Crystal Mason Procedure Date: 07/08/2020 MRN: [id number] Attending MD: Kerin Salen , MD Date of Birth: 01-20-1965 CSN: [id number] Age: 55 Admit Type: Emergency Department Procedure:                Colonoscopy Indications:              Last colonoscopy: 2013, For therapy of volvulus Providers:                Kerin Salen, MD, Estella Husk RN, RN, Beryle Beams,                            Technician,Carey Laurine Blazer CRNA Referring MD:             ER Medicines:                Monitored Anesthesia Care Complications:            No immediate complications. Estimated Blood Loss:     Estimated blood loss: none. Procedure:                Pre-Anesthesia Assessment:                           - Prior to the procedure, a History and Physical                            was performed, and patient medications and                            allergies were reviewed. The patient's tolerance of                            previous anesthesia was also reviewed. The risks                            and benefits of the procedure and the sedation                            options and risks were discussed with the patient.                            All questions were answered, and informed consent                            was obtained. Prior Anticoagulants: The patient has                            taken no previous anticoagulant or antiplatelet                            agents. ASA Grade Assessment: II - A patient with                            mild systemic disease. After reviewing the risks  and benefits, the patient was deemed in                            satisfactory condition to undergo the procedure.                           After obtaining informed consent, the colonoscope                            was passed under direct vision. Throughout the                            procedure, the patient's blood pressure, pulse, and                             oxygen saturations were monitored continuously. The                            [id number]-H190DL ([id number] Olympus pediatric colonscope                            was introduced through the anus and advanced to the                            the hepatic flexure. The colonoscopy was performed                            without difficulty. The patient tolerated the                            procedure well. The quality of the bowel                            preparation was poor. Scope In: 6:48:16 AM Scope Out: 7:09:20 AM Total Procedure Duration: 0 hours 21 minutes 4 seconds  Findings:      The perianal and digital rectal examinations were normal.      The lumen of the sigmoid colon, descending colon and transverse colon       was significantly dilated. The mucosa appeared unremarkable in the       rectum.      An obvious luminal narrowing was noted in the sigmoid.      Once the scope was advanced beyond the area of narrowing, markedly       dilated colon was noted up to the hepatic flexure with a large amount of       retained stool.      Decompression was performed, however, due to presence of large amount of       stool, it is unclear if complete decompression was achieved. Impression:               - Preparation of the colon was poor.                           - Dilated in the sigmoid colon, in the descending  colon and in the transverse colon. Decompression                            performed.                           - No specimens collected. Moderate Sedation:      Patient did not receive moderate sedation for this procedure, but       instead received monitored anesthesia care. Recommendation:           - Abdominal Xray now.                           - Surgical consult, if decompression not                            successful, depending on Xray and clinical status,                            she will need surgical  intervention. Procedure Code(s):        --- Professional ---                           [phone], 53, Colonoscopy, flexible; diagnostic,                            including collection of specimen(s) by brushing or                            washing, when performed (separate procedure) Diagnosis Code(s):        --- Professional ---                           K59.39, Other megacolon                           K56.2, Volvulus CPT copyright 2019 American [employer] Association. All rights reserved. The codes documented in this report are preliminary and upon coder review may  be revised to meet current compliance requirements. Kerin Salen, MD 07/08/2020 7:24:17 AM This report has been signed electronically. Number of Addenda: 0

## 2020-07-08 NOTE — Anesthesia Postprocedure Evaluation (Signed)
Anesthesia Post Note  Patient: Crystal Mason  Procedure(s) Performed: COLONOSCOPY     Patient location during evaluation: PACU Anesthesia Type: MAC Level of consciousness: awake and alert Pain management: pain level controlled Vital Signs Assessment: post-procedure vital signs reviewed and stable Respiratory status: spontaneous breathing, nonlabored ventilation, respiratory function stable and patient connected to nasal cannula oxygen Cardiovascular status: stable and blood pressure returned to baseline Postop Assessment: no apparent nausea or vomiting Anesthetic complications: no   No notable events documented.  Last Vitals:  Vitals:   07/08/20 0740 07/08/20 0812  BP: (!) 98/43 124/68  Pulse: 60 71  Resp: 20 18  Temp:  36.6 C  SpO2: 94% 100%    Last Pain:  Vitals:   07/08/20 0812  TempSrc: Oral  PainSc: 0-No pain                 Kaidence Sant

## 2020-07-08 NOTE — Progress Notes (Addendum)
(  Patient not seen.  X-ray result reviewed at request of Dr. Marca Ancona.)  Radiographic improvement following this morning's decompressive sigmoidoscopy.  Surgical consult note reviewed, with tentative plan for sigmoid colectomy during this admission.  Low potassium of 3.2 noted.  I will try to discuss this with the patient's attending physician.  We will sign off.  Please call us if we can be of further assistance in this patient's care.  Florencia Reasons, M.D. Pager [phone] If no answer or after 5 PM call [phone]

## 2020-07-08 NOTE — H&P (Signed)
History and Physical    TAMIE MINTEER WFU:[id number] DOB: 09-Jul-1965 DOA: 07/07/2020  PCP: Georgianne Fick, MD  Patient coming from: home   Chief Complaint: abdominal pain  HPI: Crystal Mason is a 55 y.o. female with medical history significant for ibs with chronic constipation who presents with the above.  Presented to [facility] yesterday. Experienced several days worsening abdominal pain, began as left sided but became generalized, associated with abdominal distention. No vomiting or diarrhea. Also with constipation with last BM on 6/16. She reports regular bowel movements with use of her home meds.   At [facility] CT showed signs of sigmoid volvulus. GI was consulted who advised transfer to [facility] for colonoscopy with decompression which was performed just prior to my evaluation.  Patient reports resolution of her abdominal pain and distention with colonoscopy. She says she currently feels well.   Review of Systems: As per HPI otherwise 10 point review of systems negative.    Past Medical History:  Diagnosis Date   Chronic intestinal pseudo-obstruction    IBS (irritable colon syndrome)    Constipation   Thyroid disease     Past Surgical History:  Procedure Laterality Date   dnc     EYE SURGERY     KNEE SURGERY  left   TONSILLECTOMY  01/20/1971     reports that she has never smoked. She has never used smokeless tobacco. She reports that she does not drink alcohol and does not use drugs.  Allergies  Allergen Reactions   Sulfa Antibiotics Rash    Family History  Problem Relation Age of Onset   Diabetes Father     Prior to Admission medications   Medication Sig Start Date End Date Taking? Authorizing Provider  Cholecalciferol (VITAMIN D) 2000 UNITS CAPS Take 2,000 Units by mouth 3 (three) times a week.    Yes [provider]  cycloSPORINE (RESTASIS) 0.05 % ophthalmic emulsion Place 1 drop into both eyes 2 (two) times daily.   Yes [provider]  JUNEL  1/20 1-20 MG-MCG tablet TAKE 1 TABLET BY MOUTH EVERY DAY CONTINUOUSLY.4PACKS FOR 84 DAYS 06/24/16  Yes [provider]  levothyroxine (SYNTHROID, LEVOTHROID) 100 MCG tablet Take 100 mcg by mouth daily.   Yes [provider]  LINZESS 145 MCG CAPS capsule TAKE 1 CAPSULE (145 MCG) BY MOUTH DAILY ON AN EMPTY STOMACH BEFORE FIRST MEAL OF THE DAY 05/06/16  Yes [provider]  Probiotic Product (RESTORA PO) Take 1 tablet by mouth daily.    Yes [provider]  TURMERIC PO Take 1 tablet by mouth daily.   Yes [provider]  UNABLE TO FIND Take 2 mg by mouth daily. Med Name: Resolor 2mg    Yes [provider]    Physical Exam: Vitals:   07/08/20 0718 07/08/20 0730 07/08/20 0740 07/08/20 0812  BP: (!) 98/52 104/78 (!) 98/43 124/68  Pulse: 69 68 60 71  Resp: 10 18 20 18   Temp:    97.8 F (36.6 C)  TempSrc:    Oral  SpO2: 96% 95% 94% 100%  Weight:      Height:        Constitutional: No acute distress Head: Atraumatic Eyes: Conjunctiva clear ENM: Moist mucous membranes. Normal dentition.  Neck: Supple Respiratory: Clear to auscultation bilaterally, no wheezing/rales/rhonchi. Normal respiratory effort. No accessory muscle use. . Cardiovascular: Regular rate and rhythm. No murmurs/rubs/gallops. Abdomen: Non-tender, non-distended. No masses. No rebound or guarding. Positive bowel sounds. Musculoskeletal: No joint deformity upper and  lower extremities. Normal ROM, no contractures. Normal muscle tone.  Skin: No rashes, lesions, or ulcers.  Extremities: No peripheral edema. Palpable peripheral pulses. Neurologic: Alert, moving all 4 extremities. Psychiatric: Normal insight and judgement.   Labs on Admission: I have personally reviewed following labs and imaging studies  CBC: Recent Labs  Lab 07/08/20 0030  WBC 11.3*  NEUTROABS 9.1*  HGB 13.2  HCT 40.2  MCV 94.6  PLT 349   Basic Metabolic Panel: Recent Labs  Lab 07/08/20 0030  NA  140  K 3.2*  CL 107  CO2 25  GLUCOSE 105*  BUN 13  CREATININE 1.01*  CALCIUM 8.5*   GFR: Estimated Creatinine Clearance: 62.8 mL/min (A) (by C-G formula based on SCr of 1.01 mg/dL (H)). Liver Function Tests: Recent Labs  Lab 07/08/20 0030  AST 22  ALT 19  ALKPHOS 67  BILITOT 0.7  PROT 7.6  ALBUMIN 3.9   Recent Labs  Lab 07/08/20 0030  LIPASE 27   No results for input(s): AMMONIA in the last 168 hours. Coagulation Profile: No results for input(s): INR, PROTIME in the last 168 hours. Cardiac Enzymes: No results for input(s): CKTOTAL, CKMB, CKMBINDEX, TROPONINI in the last 168 hours. BNP (last 3 results) No results for input(s): PROBNP in the last 8760 hours. HbA1C: No results for input(s): HGBA1C in the last 72 hours. CBG: No results for input(s): GLUCAP in the last 168 hours. Lipid Profile: No results for input(s): CHOL, HDL, LDLCALC, TRIG, CHOLHDL, LDLDIRECT in the last 72 hours. Thyroid Function Tests: No results for input(s): TSH, T4TOTAL, FREET4, T3FREE, THYROIDAB in the last 72 hours. Anemia Panel: No results for input(s): VITAMINB12, FOLATE, FERRITIN, TIBC, IRON, RETICCTPCT in the last 72 hours. Urine analysis:    Component Value Date/Time   BILIRUBINUR neg 03/23/2012 1536   PROTEINUR neg 03/23/2012 1536   UROBILINOGEN 0.2 03/23/2012 1536   NITRITE neg 03/23/2012 1536   LEUKOCYTESUR Negative 03/23/2012 1536    Radiological Exams on Admission: CT ABDOMEN PELVIS W CONTRAST  Result Date: 07/08/2020 CLINICAL DATA:  Acute nonlocalized abdominal pain. Pt presents with 2 day hx of abd pain. Pt with IBS-C and pseudo-obstruction. States her last BM was 6/16. Pt denies vomiting. no other complaints EXAM: CT ABDOMEN AND PELVIS WITH CONTRAST TECHNIQUE: Multidetector CT imaging of the abdomen and pelvis was performed using the standard protocol following bolus administration of intravenous contrast. CONTRAST:  OMNIPAQUE IOHEXOL 300 MG/ML  SOLN COMPARISON:  CT  abdomen pelvis 04/11/2013. FINDINGS: Lower chest: No acute abnormality. Hepatobiliary: No focal liver abnormality. No gallstones, gallbladder wall thickening, or pericholecystic fluid. No biliary dilatation. Pancreas: No focal lesion. Normal pancreatic contour. No surrounding inflammatory changes. No main pancreatic ductal dilatation. Spleen: Normal in size without focal abnormality. Adrenals/Urinary Tract: No adrenal nodule bilaterally. Bilateral kidneys enhance symmetrically. No hydronephrosis. No hydroureter. The urinary bladder is unremarkable. Stomach/Bowel: There is swirling of the mesentery within the left lower abdomen with a double beak sign associated with the sigmoid colon (proximal sigmoid colon transition point on coronal image 49, distal sigmoid colon transition point on coronal image 50). The sigmoid colon is distended with gas measuring up to 8 cm within associated air-fluid level (2:9, 5:69). Stomach is within normal limits. No evidence of small bowel wall thickening or dilatation. Stool is noted within the transverse colon. The rectum is decompressed. No pneumatosis. Appendix appears normal. Vascular/Lymphatic: No significant vascular findings are present. No enlarged abdominal or pelvic lymph nodes. Reproductive: Uterus and bilateral adnexa are unremarkable. Other:  Redemonstration nonspecific left pelvic 2 cm calcification (2:72). Several phleboliths also noted within the pelvis. Trace free fluid within the pelvis. No organized fluid collection. No pneumoperitoneum. Musculoskeletal: No abdominal wall hernia or abnormality. No suspicious lytic or blastic osseous lesions. No acute displaced fracture. IMPRESSION: Findings suggestive of a sigmoid volvulus. These results were called by telephone at the time of interpretation on 07/08/2020 at 3:11 am to provider [facility] , who verbally acknowledged these results. Electronically Signed   By: Tish Frederickson M.D.   On: 07/08/2020 03:12   DG Abdomen  Acute W/Chest  Result Date: 07/08/2020 CLINICAL DATA:  2 day hx of abd pain. Pt with IBS-C and pseudo-obstruction. EXAM: DG ABDOMEN ACUTE WITH 1 VIEW CHEST COMPARISON:  x ray abdomen 04/30/2017, CT abdomen pelvis 04/11/2048 FINDINGS: The heart size and mediastinal contours are within normal limits. Elevated left hemidiaphragm. Left base atelectasis. No focal consolidation. No pulmonary edema. No pleural effusion. No pneumothorax. Lead pipe appearance of the left colon. Gaseous dilatation of the large bowel. Stool overlies the pelvis. No evidence of free intraperitoneal air. Similar-appearing chronic 2.9 cm calcific density within the left pelvis. Several phleboliths are noted overlying the pelvis. No acute osseous abnormality. IMPRESSION: 1. Gaseous dilatation of the large bowel with appearance of the left colon consistent with inflammatory bowel disease. If a CT abdomen pelvis is clinically indicated, please obtain with intravenous contrast. 2. Limited evaluation for free intraperitoneal gas. 3. No acute cardiopulmonary disease. Electronically Signed   By: Tish Frederickson M.D.   On: 07/08/2020 00:57     Assessment/Plan Principal Problem:   Sigmoid volvulus (HCC) Active Problems:   Hypothyroidism   IBS (irritable bowel syndrome)   Chronic constipation   # acute sigmoid volvulus Patient's history of chronic constipation likely contributory. Is now s/p endoscopic decompression and patient's symptoms have resolved. - GI and gen surg following - abd x ray ordered - npo for now  # Hypokalemia Likely 2/2 reduced PO as ov late. K 3.2 - f/u Mg - kcl 20 meq w/ IVF  # Chronic constipation # IBS - home trulance and motegrity on hold  # Hypothyroid - resume home synthroid when taking PO  DVT prophylaxis: SCDs Code Status: full  Family Communication: husband updated @ bedside 6/20  Consults called: GI, gen surg   Level of care: Med-Surg Status is: Inpatient  Not inpatient appropriate, will  call UM team and downgrade to OBS.   Dispo: The patient is from: Home              Anticipated d/c is to: Home              Patient currently is not medically stable to d/c.   Difficult to place patient No        Silvano Bilis MD [facility] Pager [phone]  If 7PM-7AM, please contact night-coverage [email or web address] Password [facility]  07/08/2020, 8:33 AM

## 2020-07-08 NOTE — ED Provider Notes (Signed)
[facility] EMERGENCY DEPARTMENT Provider Note  CSN: [id number] Arrival date & time: 07/07/20 2258  Chief Complaint(s) Abdominal Pain  HPI Crystal Mason is a 55 y.o. female    Abdominal Pain Pain location:  Generalized Pain quality: aching and bloating   Pain radiates to:  Back Pain severity:  Moderate Onset quality:  Gradual Duration:  2 days Timing:  Constant Progression:  Worsening Chronicity:  Recurrent Context comment:  Constipation Relieved by:  Nothing Worsened by:  Nothing Associated symptoms: constipation and nausea   Associated symptoms: no vomiting    Past Medical History Past Medical History:  Diagnosis Date   Chronic intestinal pseudo-obstruction    IBS (irritable colon syndrome)    Constipation   Thyroid disease    Patient Active Problem List   Diagnosis Date Noted   Muscle twitching 07/30/2016   Abdominal cramping 11/21/2012   Perimenopause 10/03/2012   Hypothyroidism 10/03/2012   Diarrhea 10/03/2012   Abdominal pain 10/03/2012   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 Authorizing Provider  Cholecalciferol (VITAMIN D) 2000 UNITS CAPS Take 2,000 Units by mouth 3 (three) times a week.     [provider]  cycloSPORINE (RESTASIS) 0.05 % ophthalmic emulsion Place 1 drop into both eyes 2 (two) times daily.    [provider]  JUNEL 1/20 1-20 MG-MCG tablet TAKE 1 TABLET BY MOUTH EVERY DAY CONTINUOUSLY.4PACKS FOR 84 DAYS 06/24/16   [provider]  levothyroxine (SYNTHROID, LEVOTHROID) 100 MCG tablet Take 100 mcg by mouth daily.    [provider]  LINZESS 145 MCG CAPS capsule TAKE 1 CAPSULE (145 MCG) BY MOUTH DAILY ON AN EMPTY STOMACH BEFORE FIRST MEAL OF THE DAY 05/06/16   [provider]  Probiotic Product (RESTORA PO) Take 1 tablet by mouth daily.     [provider]  TURMERIC PO Take 1  tablet by mouth daily.    [provider]  UNABLE TO FIND Take 2 mg by mouth daily. Med Name: Resolor 2mg     [provider]                                                                                                                                    Past Surgical History Past Surgical History:  Procedure Laterality Date   dnc     EYE SURGERY     KNEE SURGERY  left   TONSILLECTOMY  01/20/1971   Family History Family History  Problem Relation Age of Onset   Diabetes Father     Social History Social History   Tobacco Use   Smoking status: Never   Smokeless tobacco: Never  Vaping Use   Vaping Use: Never used  Substance Use Topics   Alcohol use: No   Drug use: No   Allergies Sulfa antibiotics  Review of Systems Review of  Systems  Gastrointestinal:  Positive for abdominal pain, constipation and nausea. Negative for vomiting.  All other systems are reviewed and are negative for acute change except as noted in the HPI  Physical Exam Vital Signs  I have reviewed the triage vital signs BP (!) 141/82 (BP Location: Left Arm)   Pulse 78   Temp 98.1 F (36.7 C) (Oral)   Resp 20   Ht 5\' 5"  (1.651 m)   Wt 72.6 kg   LMP 03/05/2010   SpO2 100%   BMI 26.63 kg/m   Physical Exam Vitals reviewed.  Constitutional:      General: She is not in acute distress.    Appearance: She is well-developed. She is not diaphoretic.  HENT:     Head: Normocephalic and atraumatic.     Right Ear: External ear normal.     Left Ear: External ear normal.     Nose: Nose normal.  Eyes:     General: No scleral icterus.    Conjunctiva/sclera: Conjunctivae normal.  Neck:     Trachea: Phonation normal.  Cardiovascular:     Rate and Rhythm: Normal rate and regular rhythm.  Pulmonary:     Effort: Pulmonary effort is normal. No respiratory distress.     Breath sounds: No stridor.  Abdominal:     General: There is distension.     Tenderness: There is generalized abdominal  tenderness. There is no guarding or rebound.  Musculoskeletal:        General: Normal range of motion.     Cervical back: Normal range of motion.  Neurological:     Mental Status: She is alert and oriented to person, place, and time.  Psychiatric:        Behavior: Behavior normal.    ED Results and Treatments Labs (all labs ordered are listed, but only abnormal results are displayed) Labs Reviewed  CBC WITH DIFFERENTIAL/PLATELET - Abnormal; Notable for the following components:      Result Value   WBC 11.3 (*)    Neutro Abs 9.1 (*)    All other components within normal limits  COMPREHENSIVE METABOLIC PANEL - Abnormal; Notable for the following components:   Potassium 3.2 (*)    Glucose, Bld 105 (*)    Creatinine, Ser 1.01 (*)    Calcium 8.5 (*)    All other components within normal limits  RESP PANEL BY RT-PCR (FLU A&B, COVID) ARPGX2  LIPASE, BLOOD                                                                                                                         EKG  EKG Interpretation  Date/Time:    Ventricular Rate:    PR Interval:    QRS Duration:   QT Interval:    QTC Calculation:   R Axis:     Text Interpretation:          Radiology CT ABDOMEN PELVIS W CONTRAST  Result Date: 07/08/2020 CLINICAL DATA:  Acute nonlocalized abdominal pain. Pt presents with 2 day hx of abd pain. Pt with IBS-C and pseudo-obstruction. States her last BM was 6/16. Pt denies vomiting. no other complaints EXAM: CT ABDOMEN AND PELVIS WITH CONTRAST TECHNIQUE: Multidetector CT imaging of the abdomen and pelvis was performed using the standard protocol following bolus administration of intravenous contrast. CONTRAST:  OMNIPAQUE IOHEXOL 300 MG/ML  SOLN COMPARISON:  CT abdomen pelvis 04/11/2013. FINDINGS: Lower chest: No acute abnormality. Hepatobiliary: No focal liver abnormality. No gallstones, gallbladder wall thickening, or pericholecystic fluid. No biliary dilatation. Pancreas: No  focal lesion. Normal pancreatic contour. No surrounding inflammatory changes. No main pancreatic ductal dilatation. Spleen: Normal in size without focal abnormality. Adrenals/Urinary Tract: No adrenal nodule bilaterally. Bilateral kidneys enhance symmetrically. No hydronephrosis. No hydroureter. The urinary bladder is unremarkable. Stomach/Bowel: There is swirling of the mesentery within the left lower abdomen with a double beak sign associated with the sigmoid colon (proximal sigmoid colon transition point on coronal image 49, distal sigmoid colon transition point on coronal image 50). The sigmoid colon is distended with gas measuring up to 8 cm within associated air-fluid level (2:9, 5:69). Stomach is within normal limits. No evidence of small bowel wall thickening or dilatation. Stool is noted within the transverse colon. The rectum is decompressed. No pneumatosis. Appendix appears normal. Vascular/Lymphatic: No significant vascular findings are present. No enlarged abdominal or pelvic lymph nodes. Reproductive: Uterus and bilateral adnexa are unremarkable. Other: Redemonstration nonspecific left pelvic 2 cm calcification (2:72). Several phleboliths also noted within the pelvis. Trace free fluid within the pelvis. No organized fluid collection. No pneumoperitoneum. Musculoskeletal: No abdominal wall hernia or abnormality. No suspicious lytic or blastic osseous lesions. No acute displaced fracture. IMPRESSION: Findings suggestive of a sigmoid volvulus. These results were called by telephone at the time of interpretation on 07/08/2020 at 3:11 am to provider [facility] , who verbally acknowledged these results. Electronically Signed   By: Tish Frederickson M.D.   On: 07/08/2020 03:12   DG Abdomen Acute W/Chest  Result Date: 07/08/2020 CLINICAL DATA:  2 day hx of abd pain. Pt with IBS-C and pseudo-obstruction. EXAM: DG ABDOMEN ACUTE WITH 1 VIEW CHEST COMPARISON:  x ray abdomen 04/30/2017, CT abdomen pelvis  04/11/2048 FINDINGS: The heart size and mediastinal contours are within normal limits. Elevated left hemidiaphragm. Left base atelectasis. No focal consolidation. No pulmonary edema. No pleural effusion. No pneumothorax. Lead pipe appearance of the left colon. Gaseous dilatation of the large bowel. Stool overlies the pelvis. No evidence of free intraperitoneal air. Similar-appearing chronic 2.9 cm calcific density within the left pelvis. Several phleboliths are noted overlying the pelvis. No acute osseous abnormality. IMPRESSION: 1. Gaseous dilatation of the large bowel with appearance of the left colon consistent with inflammatory bowel disease. If a CT abdomen pelvis is clinically indicated, please obtain with intravenous contrast. 2. Limited evaluation for free intraperitoneal gas. 3. No acute cardiopulmonary disease. Electronically Signed   By: Tish Frederickson M.D.   On: 07/08/2020 00:57    Pertinent labs & imaging results that were available during my care of the patient were reviewed by me and considered in my medical decision making (see chart for details).  Medications Ordered in ED Medications  sodium chloride 0.9 % bolus 1,000 mL (has no administration in time range)  ketorolac (TORADOL) 15 MG/ML injection 15 mg (15 mg Intravenous Given 07/08/20 0028)  sodium chloride 0.9 % bolus 1,000 mL (1,000 mLs Intravenous New Bag/Given 07/08/20 0219)  iohexol (OMNIPAQUE) 300 MG/ML solution  100 mL (100 mLs Intravenous Contrast Given 07/08/20 0159)  HYDROmorphone (DILAUDID) injection 0.5 mg (0.5 mg Intravenous Given 07/08/20 0302)  HYDROmorphone (DILAUDID) injection 0.5 mg (0.5 mg Intravenous Given 07/08/20 0344)                                                                                                                                    Procedures Procedures  (including critical care time)  Medical Decision Making / ED Course I have reviewed the nursing notes for this encounter and the patient's  prior records (if available in EHR or on provided paperwork).   Crystal Mason was evaluated in Emergency Department on 07/08/2020 for the symptoms described in the history of present illness. She was evaluated in the context of the global COVID-19 pandemic, which necessitated consideration that the patient might be at risk for infection with the SARS-CoV-2 virus that causes COVID-19. Institutional protocols and algorithms that pertain to the evaluation of patients at risk for COVID-19 are in a state of rapid change based on information released by regulatory bodies including the CDC and federal and state organizations. These policies and algorithms were followed during the patient's care in the ED.  Work up with sigmoid volvulus w/o ischemia. Provided with IVF and pain medicine. Consult to surgery and GI. Will admit to medicine.  Spoke with Dr Marca Ancona from [facility] who requested transfer to [facility] for intervention.       Final Clinical Impression(s) / ED Diagnoses Final diagnoses:  Sigmoid volvulus (HCC)      This chart was dictated using voice recognition software.  Despite best efforts to proofread,  errors can occur which can change the documentation meaning.    Nira Conn, MD 07/08/20 [phone]

## 2020-07-08 NOTE — H&P (View-Only) (Signed)
CC: lower abdominal pain  Requesting provider: dr wouk  HPI: Crystal Mason is an 55 y.o. female who is here for because of worsening lower abdominal pain and cramping.  She started having lower abdominal pain and some cramping on Friday and her symptoms progressively worsened over the weekend.  She does have a history of chronic constipation/IBS with constipation features and takes Trulance and Motegrity on a daily basis.  She still have a bowel movement daily.  Her last bowel movement was on Thursday.  When she became more and more bloated and had worsening worsening pain she presented to [facility] and was evaluated.  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  Her labs were essentially normal except for very mild leukocytosis of 11 and mild hypokalemia.  She had a remote history of colonoscopy from 2013 and random colonic biopsies which were reportedly negative.  She has a remote history of CT enterography that was reported as normal.  She was taken by gastroenterology to endoscopy this morning and underwent colonoscopy and decompression was performed but there is a large amount of stool.  Post procedure plain film revealed improved bowel gas pattern with some residual distention of the sigmoid and splenic flexure but upstream colon is now largely normal.  States that she feels much better.  Her husband is at the bedside.  She denies any current abdominal pain.  Past Medical History:  Diagnosis Date   Chronic intestinal pseudo-obstruction    IBS (irritable colon syndrome)    Constipation   Thyroid disease     Past Surgical History:  Procedure Laterality Date   dnc     EYE SURGERY     KNEE SURGERY  left   TONSILLECTOMY  01/20/1971    Family History  Problem Relation Age of Onset   Diabetes Father     Social:  reports that she has never smoked. She has never used smokeless tobacco. She  reports that she does not drink alcohol and does not use drugs.  Allergies:  Allergies  Allergen Reactions   Sulfa Antibiotics Rash    Medications: I have reviewed the patient's current medications.   ROS - all of the below systems have been reviewed with the patient and positives are indicated with bold text General: chills, fever or night sweats Eyes: blurry vision or double vision ENT: epistaxis or sore throat Allergy/Immunology: itchy/watery eyes or nasal congestion Hematologic/Lymphatic: bleeding problems, blood clots or swollen lymph nodes Endocrine: temperature intolerance or unexpected weight changes Breast: new or changing breast lumps or nipple discharge Resp: cough, shortness of breath, or wheezing CV: chest pain or dyspnea on exertion GI: as per HPI GU: dysuria, trouble voiding, or hematuria MSK: joint pain or joint stiffness Neuro: TIA or stroke symptoms Derm: pruritus and skin lesion changes Psych: anxiety and depression  PE Blood pressure 121/71, pulse 73, temperature 98 F (36.7 C), temperature source Oral, resp. rate 16, height 5' 5" (1.651 m), weight 72.6 kg, last menstrual period 03/05/2010, SpO2 99 %. Constitutional: NAD; conversant; no deformities Eyes: Moist conjunctiva; no lid lag; anicteric; PERRL Neck: Trachea midline; no thyromegaly Lungs: Normal respiratory effort; no tactile fremitus CV: RRR; no palpable thrills; no pitting edema GI: Abd soft, nontender, no guarding; no palpable hepatosplenomegaly MSK: Normal gait; no clubbing/cyanosis Psychiatric: Appropriate affect; alert and oriented x3 Lymphatic: No palpable cervical or axillary lymphadenopathy Skin:no rash/lesions/induration  Results for orders placed or   performed during the hospital encounter of 07/07/20 (from the past 48 hour(s))  CBC with Differential/Platelet     Status: Abnormal   Collection Time: 07/08/20 12:30 AM  Result Value Ref Range   WBC 11.3 (H) 4.0 - 10.5 K/uL   RBC 4.25  3.87 - 5.11 MIL/uL   Hemoglobin 13.2 12.0 - 15.0 g/dL   HCT 16.140.2 09.636.0 - 04.546.0 %   MCV 94.6 80.0 - 100.0 fL   MCH 31.1 26.0 - 34.0 pg   MCHC 32.8 30.0 - 36.0 g/dL   RDW 40.913.3 81.111.5 - 91.415.5 %   Platelets 349 150 - 400 K/uL   nRBC 0.0 0.0 - 0.2 %   Neutrophils Relative % 81 %   Neutro Abs 9.1 (H) 1.7 - 7.7 K/uL   Lymphocytes Relative 15 %   Lymphs Abs 1.7 0.7 - 4.0 K/uL   Monocytes Relative 4 %   Monocytes Absolute 0.5 0.1 - 1.0 K/uL   Eosinophils Relative 0 %   Eosinophils Absolute 0.1 0.0 - 0.5 K/uL   Basophils Relative 0 %   Basophils Absolute 0.0 0.0 - 0.1 K/uL   Immature Granulocytes 0 %   Abs Immature Granulocytes 0.02 0.00 - 0.07 K/uL    Comment: Performed at [facility], [address] [place], [place] [address]  Comprehensive metabolic panel     Status: Abnormal   Collection Time: 07/08/20 12:30 AM  Result Value Ref Range   Sodium 140 135 - 145 mmol/L   Potassium 3.2 (L) 3.5 - 5.1 mmol/L   Chloride 107 98 - 111 mmol/L   CO2 25 22 - 32 mmol/L   Glucose, Bld 105 (H) 70 - 99 mg/dL    Comment: Glucose reference range applies only to samples taken after fasting for at least 8 hours.   BUN 13 6 - 20 mg/dL   Creatinine, Ser 6.211.01 (H) 0.44 - 1.00 mg/dL   Calcium 8.5 (L) 8.9 - 10.3 mg/dL   Total Protein 7.6 6.5 - 8.1 g/dL   Albumin 3.9 3.5 - 5.0 g/dL   AST 22 15 - 41 U/L   ALT 19 0 - 44 U/L   Alkaline Phosphatase 67 38 - 126 U/L   Total Bilirubin 0.7 0.3 - 1.2 mg/dL   GFR, Estimated >30>60 >86>60 mL/min    Comment: (NOTE) Calculated using the CKD-EPI Creatinine Equation (2021)    Anion gap 8 5 - 15    Comment: Performed at [facility], [address] [place], [place] [address]  Lipase, blood     Status: None   Collection Time: 07/08/20 12:30 AM  Result Value Ref Range   Lipase 27 11 - 51 U/L    Comment: Performed at [facility], [address] [place], [place] [address]  Resp Panel by RT-PCR (Flu A&B, Covid) Nasopharyngeal Swab     Status:  None   Collection Time: 07/08/20  3:43 AM   Specimen: Nasopharyngeal Swab; Nasopharyngeal(NP) swabs in vial transport medium  Result Value Ref Range   SARS Coronavirus 2 by RT PCR NEGATIVE NEGATIVE    Comment: (NOTE) SARS-CoV-2 target nucleic acids are NOT DETECTED.  The SARS-CoV-2 RNA is generally detectable in upper respiratory specimens during the acute phase of infection. The lowest concentration of SARS-CoV-2 viral copies this assay can detect is 138 copies/mL. A negative result does not preclude SARS-Cov-2 infection and should not be used as the sole basis for treatment or other patient management decisions.  A negative result may occur with  improper specimen collection/handling, submission of specimen other than nasopharyngeal swab, presence of viral mutation(s) within the areas targeted by this assay, and inadequate number of viral copies(<138 copies/mL). A negative result must be combined with clinical observations, patient history, and epidemiological information. The expected result is Negative.  Fact Sheet for Patients:  BloggerCourse.com  Fact Sheet for Healthcare Providers:  SeriousBroker.it  This test is no t yet approved or cleared by the [place] FDA and  has been authorized for detection and/or diagnosis of SARS-CoV-2 by FDA under an Emergency Use Authorization (EUA). This EUA will remain  in effect (meaning this test can be used) for the duration of the COVID-19 declaration under Section 564(b)(1) of the Act, 21 U.S.C.section 360bbb-3(b)(1), unless the authorization is terminated  or revoked sooner.       Influenza A by PCR NEGATIVE NEGATIVE   Influenza B by PCR NEGATIVE NEGATIVE    Comment: (NOTE) The Xpert Xpress SARS-CoV-2/FLU/RSV plus assay is intended as an aid in the diagnosis of influenza from Nasopharyngeal swab specimens and should not be used as a sole basis for treatment. Nasal washings  and aspirates are unacceptable for Xpert Xpress SARS-CoV-2/FLU/RSV testing.  Fact Sheet for Patients: BloggerCourse.com  Fact Sheet for Healthcare Providers: SeriousBroker.it  This test is not yet approved or cleared by the [place] FDA and has been authorized for detection and/or diagnosis of SARS-CoV-2 by FDA under an Emergency Use Authorization (EUA). This EUA will remain in effect (meaning this test can be used) for the duration of the COVID-19 declaration under Section 564(b)(1) of the Act, 21 U.S.C. section 360bbb-3(b)(1), unless the authorization is terminated or revoked.  Performed at [facility], [address] [place], [place] [address]   Type and screen [facility]     Status: None   Collection Time: 07/08/20  8:51 AM  Result Value Ref Range   ABO/RH(D) A POS    Antibody Screen NEG    Sample Expiration      07/11/2020,2359 Performed at [facility], [address] [place], [place] [address]   ABO/Rh     Status: None   Collection Time: 07/08/20  8:59 AM  Result Value Ref Range   ABO/RH(D)      A POS Performed at [facility], [address] [place], [place] [address]   Magnesium     Status: None   Collection Time: 07/08/20  9:01 AM  Result Value Ref Range   Magnesium 1.9 1.7 - 2.4 mg/dL    Comment: Performed at [facility], [address] [place], [place] [address]    DG Abd 1 View  Result Date: 07/08/2020 CLINICAL DATA:  55 year old female status post endoscopic decompression of sigmoid volvulus. EXAM: ABDOMEN - 1 VIEW COMPARISON:  CT Abdomen and Pelvis 0205 hours. FINDINGS: Portable AP supine view at 0830 hours. Improved bowel gas pattern from 0205 hours today, with residual gas distended redundant sigmoid measuring 8-9 mm diameter. Splenic flexure is still gas distended. But the descending and mid transverse colon  are no longer distended. Stable dystrophic calcification in the left hemipelvis and excreted IV contrast now in the urinary bladder. Negative visible right lung base. No acute osseous abnormality identified. IMPRESSION: Improved bowel gas pattern from 0205 hours today, residual distension of the sigmoid and splenic flexure, but the upstream colon is now largely normal. Electronically Signed   By: Althea Grimmer.D.  On: 07/08/2020 08:59   CT ABDOMEN PELVIS W CONTRAST  Result Date: 07/08/2020 CLINICAL DATA:  Acute nonlocalized abdominal pain. Pt presents with 2 day hx of abd pain. Pt with IBS-C and pseudo-obstruction. States her last BM was 6/16. Pt denies vomiting. no other complaints EXAM: CT ABDOMEN AND PELVIS WITH CONTRAST TECHNIQUE: Multidetector CT imaging of the abdomen and pelvis was performed using the standard protocol following bolus administration of intravenous contrast. CONTRAST:  OMNIPAQUE IOHEXOL 300 MG/ML  SOLN COMPARISON:  CT abdomen pelvis 04/11/2013. FINDINGS: Lower chest: No acute abnormality. Hepatobiliary: No focal liver abnormality. No gallstones, gallbladder wall thickening, or pericholecystic fluid. No biliary dilatation. Pancreas: No focal lesion. Normal pancreatic contour. No surrounding inflammatory changes. No main pancreatic ductal dilatation. Spleen: Normal in size without focal abnormality. Adrenals/Urinary Tract: No adrenal nodule bilaterally. Bilateral kidneys enhance symmetrically. No hydronephrosis. No hydroureter. The urinary bladder is unremarkable. Stomach/Bowel: There is swirling of the mesentery within the left lower abdomen with a double beak sign associated with the sigmoid colon (proximal sigmoid colon transition point on coronal image 49, distal sigmoid colon transition point on coronal image 50). The sigmoid colon is distended with gas measuring up to 8 cm within associated air-fluid level (2:9, 5:69). Stomach is within normal limits. No evidence of small bowel wall  thickening or dilatation. Stool is noted within the transverse colon. The rectum is decompressed. No pneumatosis. Appendix appears normal. Vascular/Lymphatic: No significant vascular findings are present. No enlarged abdominal or pelvic lymph nodes. Reproductive: Uterus and bilateral adnexa are unremarkable. Other: Redemonstration nonspecific left pelvic 2 cm calcification (2:72). Several phleboliths also noted within the pelvis. Trace free fluid within the pelvis. No organized fluid collection. No pneumoperitoneum. Musculoskeletal: No abdominal wall hernia or abnormality. No suspicious lytic or blastic osseous lesions. No acute displaced fracture. IMPRESSION: Findings suggestive of a sigmoid volvulus. These results were called by telephone at the time of interpretation on 07/08/2020 at 3:11 am to provider [facility] , who verbally acknowledged these results. Electronically Signed   By: Tish Frederickson M.D.   On: 07/08/2020 03:12   DG Abdomen Acute W/Chest  Result Date: 07/08/2020 CLINICAL DATA:  2 day hx of abd pain. Pt with IBS-C and pseudo-obstruction. EXAM: DG ABDOMEN ACUTE WITH 1 VIEW CHEST COMPARISON:  x ray abdomen 04/30/2017, CT abdomen pelvis 04/11/2048 FINDINGS: The heart size and mediastinal contours are within normal limits. Elevated left hemidiaphragm. Left base atelectasis. No focal consolidation. No pulmonary edema. No pleural effusion. No pneumothorax. Lead pipe appearance of the left colon. Gaseous dilatation of the large bowel. Stool overlies the pelvis. No evidence of free intraperitoneal air. Similar-appearing chronic 2.9 cm calcific density within the left pelvis. Several phleboliths are noted overlying the pelvis. No acute osseous abnormality. IMPRESSION: 1. Gaseous dilatation of the large bowel with appearance of the left colon consistent with inflammatory bowel disease. If a CT abdomen pelvis is clinically indicated, please obtain with intravenous contrast. 2. Limited evaluation for  free intraperitoneal gas. 3. No acute cardiopulmonary disease. Electronically Signed   By: Tish Frederickson M.D.   On: 07/08/2020 00:57    Imaging: reviewed  A/P: Crystal Mason is an 55 y.o. 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  We discussed sigmoid volvulus.  She was given Agricultural engineer.  She is resting calmly.  Her abdomen is benign.  She is nontoxic-appearing.  Therefore she does not  need urgent surgical intervention.  We did discuss the risk of recurrent sigmoid volvulus is rather high and therefore ultimately she will need sigmoid colectomy.  We have tentatively discussed performing this procedure while she is in hospital.  The goal would be to do a gentle bowel prep and then do sigmoid colectomy in the next few days.  Therefore we could do the procedure in 1 stage as opposed to having to do a diverting end colostomy  I discussed the procedure in detail.  We discussed the risks and benefits of surgery including, but not limited to bleeding, infection (such as wound infection, abdominal abscess), injury to surrounding structures, blood clot formation, urinary retention, incisional hernia, anastomotic stricture, anastomotic leak, anesthesia risks, potential need for temporary ostomy, pulmonary & cardiac complications such as pneumonia &/or heart attack, need for additional procedures, ileus, & prolonged hospitalization.  We discussed the typical postoperative recovery course, including limitations & restrictions postoperatively. I explained that the likelihood of improvement in their symptoms is good.  We will follow along Patient may have clear liquid diet today Ok for Chemical VTE prophylaxis   Mary Sella. Andrey Campanile, MD, FACS General, Bariatric, & Minimally Invasive Surgery [facility], [place]

## 2020-07-08 NOTE — Transfer of Care (Signed)
Immediate Anesthesia Transfer of Care Note  Patient: Crystal Mason  Procedure(s) Performed: COLONOSCOPY  Patient Location: PACU and Endoscopy Unit  Anesthesia Type:MAC  Level of Consciousness: awake, alert  and oriented  Airway & Oxygen Therapy: Patient Spontanous Breathing  Post-op Assessment: Report given to RN and Post -op Vital signs reviewed and stable  Post vital signs: Reviewed and stable  Last Vitals:  Vitals Value Taken Time  BP 98/52 07/08/20 0718  Temp    Pulse 69 07/08/20 0718  Resp 10 07/08/20 0718  SpO2 96 % 07/08/20 0718    Last Pain:  Vitals:   07/08/20 0422  TempSrc:   PainSc: 3          Complications: No notable events documented.

## 2020-07-09 DIAGNOSIS — Z7951 Long term (current) use of inhaled steroids: Secondary | ICD-10-CM | POA: Diagnosis not present

## 2020-07-09 DIAGNOSIS — Z20822 Contact with and (suspected) exposure to covid-19: Secondary | ICD-10-CM | POA: Diagnosis present

## 2020-07-09 DIAGNOSIS — Z79899 Other long term (current) drug therapy: Secondary | ICD-10-CM | POA: Diagnosis not present

## 2020-07-09 DIAGNOSIS — K581 Irritable bowel syndrome with constipation: Secondary | ICD-10-CM | POA: Diagnosis present

## 2020-07-09 DIAGNOSIS — E039 Hypothyroidism, unspecified: Secondary | ICD-10-CM | POA: Diagnosis present

## 2020-07-09 DIAGNOSIS — Z833 Family history of diabetes mellitus: Secondary | ICD-10-CM | POA: Diagnosis not present

## 2020-07-09 DIAGNOSIS — E876 Hypokalemia: Secondary | ICD-10-CM | POA: Diagnosis present

## 2020-07-09 DIAGNOSIS — Z5331 Laparoscopic surgical procedure converted to open procedure: Secondary | ICD-10-CM | POA: Diagnosis not present

## 2020-07-09 DIAGNOSIS — K5939 Other megacolon: Secondary | ICD-10-CM | POA: Diagnosis present

## 2020-07-09 DIAGNOSIS — K562 Volvulus: Secondary | ICD-10-CM | POA: Diagnosis present

## 2020-07-09 DIAGNOSIS — K5989 Other specified functional intestinal disorders: Secondary | ICD-10-CM | POA: Insufficient documentation

## 2020-07-09 DIAGNOSIS — M25512 Pain in left shoulder: Secondary | ICD-10-CM | POA: Diagnosis not present

## 2020-07-09 LAB — CBC
HCT: 32.9 % — ABNORMAL LOW (ref 36.0–46.0)
Hemoglobin: 10.8 g/dL — ABNORMAL LOW (ref 12.0–15.0)
MCH: 31.8 pg (ref 26.0–34.0)
MCHC: 32.8 g/dL (ref 30.0–36.0)
MCV: 96.8 fL (ref 80.0–100.0)
Platelets: 250 10*3/uL (ref 150–400)
RBC: 3.4 MIL/uL — ABNORMAL LOW (ref 3.87–5.11)
RDW: 13.4 % (ref 11.5–15.5)
WBC: 6.9 10*3/uL (ref 4.0–10.5)
nRBC: 0 % (ref 0.0–0.2)

## 2020-07-09 LAB — BASIC METABOLIC PANEL
Anion gap: 4 — ABNORMAL LOW (ref 5–15)
BUN: 6 mg/dL (ref 6–20)
CO2: 24 mmol/L (ref 22–32)
Calcium: 7.9 mg/dL — ABNORMAL LOW (ref 8.9–10.3)
Chloride: 112 mmol/L — ABNORMAL HIGH (ref 98–111)
Creatinine, Ser: 0.92 mg/dL (ref 0.44–1.00)
GFR, Estimated: >60 mL/min (ref >60)
Glucose, Bld: 113 mg/dL — ABNORMAL HIGH (ref 70–99)
Potassium: 3.6 mmol/L (ref 3.5–5.1)
Sodium: 140 mmol/L (ref 135–145)

## 2020-07-09 LAB — MAGNESIUM: Magnesium: 1.9 mg/dL (ref 1.7–2.4)

## 2020-07-09 LAB — HIV ANTIBODY (ROUTINE TESTING W REFLEX): HIV Screen 4th Generation wRfx: NONREACTIVE

## 2020-07-09 MED ORDER — ENSURE PRE-SURGERY PO LIQD
592.0000 mL | Freq: Once | ORAL | Status: DC
  Filled 2020-07-09: qty 592

## 2020-07-09 MED ORDER — POLYETHYLENE GLYCOL 3350 17 GM/SCOOP PO POWD
1.0000 | Freq: Once | ORAL | Status: AC
  Administered 2020-07-09: 255 g via ORAL
  Filled 2020-07-09: qty 255

## 2020-07-09 MED ORDER — ENSURE PRE-SURGERY PO LIQD
296.0000 mL | Freq: Once | ORAL | Status: AC
  Administered 2020-07-10: 296 mL via ORAL
  Filled 2020-07-09: qty 296

## 2020-07-09 MED ORDER — CHLORHEXIDINE GLUCONATE CLOTH 2 % EX PADS
6.0000 | MEDICATED_PAD | Freq: Once | CUTANEOUS | Status: AC
  Administered 2020-07-09: 6 via TOPICAL

## 2020-07-09 MED ORDER — HEPARIN SODIUM (PORCINE) 5000 UNIT/ML IJ SOLN
5000.0000 [IU] | Freq: Once | INTRAMUSCULAR | Status: AC
  Administered 2020-07-10: 5000 [IU] via SUBCUTANEOUS
  Filled 2020-07-09: qty 1

## 2020-07-09 MED ORDER — SODIUM CHLORIDE 0.9 % IV SOLN
2.0000 g | INTRAVENOUS | Status: AC
  Administered 2020-07-10: 2 g via INTRAVENOUS
  Filled 2020-07-09: qty 2

## 2020-07-09 MED ORDER — ACETAMINOPHEN 500 MG PO TABS
1000.0000 mg | ORAL_TABLET | ORAL | Status: AC
Start: 2020-07-10 — End: 2020-07-10
  Administered 2020-07-10: 1000 mg via ORAL
  Filled 2020-07-09: qty 2

## 2020-07-09 MED ORDER — CHLORHEXIDINE GLUCONATE CLOTH 2 % EX PADS
6.0000 | MEDICATED_PAD | Freq: Once | CUTANEOUS | Status: AC
  Administered 2020-07-10: 6 via TOPICAL

## 2020-07-09 MED ORDER — NEOMYCIN SULFATE 500 MG PO TABS
1000.0000 mg | ORAL_TABLET | ORAL | Status: AC
  Administered 2020-07-09 (×3): 1000 mg via ORAL
  Filled 2020-07-09 (×3): qty 2

## 2020-07-09 MED ORDER — GABAPENTIN 300 MG PO CAPS
300.0000 mg | ORAL_CAPSULE | ORAL | Status: AC
  Administered 2020-07-10: 300 mg via ORAL
  Filled 2020-07-09: qty 1

## 2020-07-09 MED ORDER — BUPIVACAINE LIPOSOME 1.3 % IJ SUSP
20.0000 mL | Freq: Once | INTRAMUSCULAR | Status: DC
  Filled 2020-07-09: qty 20

## 2020-07-09 MED ORDER — METRONIDAZOLE 500 MG PO TABS
1000.0000 mg | ORAL_TABLET | ORAL | Status: AC
  Administered 2020-07-09 (×3): 1000 mg via ORAL
  Filled 2020-07-09 (×3): qty 2

## 2020-07-09 MED ORDER — ALVIMOPAN 12 MG PO CAPS
12.0000 mg | ORAL_CAPSULE | ORAL | Status: AC
  Administered 2020-07-10: 12 mg via ORAL
  Filled 2020-07-09: qty 1

## 2020-07-09 NOTE — Anesthesia Preprocedure Evaluation (Addendum)
Anesthesia Evaluation  Patient identified by MRN, date of birth, ID band Patient awake    Reviewed: Allergy & Precautions, NPO status , Patient's Chart, lab work & pertinent test results  Airway Mallampati: II  TM Distance: >3 FB Neck ROM: Full    Dental no notable dental hx. (+) Teeth Intact, Dental Advisory Given   Pulmonary neg pulmonary ROS,    Pulmonary exam normal breath sounds clear to auscultation       Cardiovascular Exercise Tolerance: Good Normal cardiovascular exam Rhythm:Regular Rate:Normal     Neuro/Psych negative neurological ROS  negative psych ROS   GI/Hepatic Neg liver ROS,   Endo/Other  Hypothyroidism   Renal/GU Lab Results      Component                Value               Date                      CREATININE               0.92                07/09/2020                BUN                      6                   07/09/2020                NA                       140                 07/09/2020                K                        3.6                 07/09/2020                CL                       112 (H)             07/09/2020                CO2                      24                  07/09/2020                Musculoskeletal negative musculoskeletal ROS (+)   Abdominal   Peds  Hematology Lab Results      Component                Value               Date                      WBC                      6.9  07/09/2020                HGB                      10.8 (L)            07/09/2020                HCT                      32.9 (L)            07/09/2020                MCV                      96.8                07/09/2020                PLT                      250                 07/09/2020              Anesthesia Other Findings WCB:JSEGB  Reproductive/Obstetrics negative OB ROS                            Anesthesia Physical Anesthesia  Plan  ASA: 2  Anesthesia Plan: General   Post-op Pain Management:    Induction: Intravenous  PONV Risk Score and Plan: 4 or greater and Treatment may vary due to age or medical condition, Midazolam, Dexamethasone and Ondansetron  Airway Management Planned: Oral ETT  Additional Equipment: None  Intra-op Plan:   Post-operative Plan: Extubation in OR  Informed Consent: I have reviewed the patients History and Physical, chart, labs and discussed the procedure including the risks, benefits and alternatives for the proposed anesthesia with the patient or authorized representative who has indicated his/her understanding and acceptance.     Dental advisory given  Plan Discussed with: CRNA  Anesthesia Plan Comments: (GA w lidocaine infusion + ketamine)       Anesthesia Quick Evaluation

## 2020-07-09 NOTE — Progress Notes (Signed)
1 Day Post-Op  Subjective: CC: Patient reports no abdominal pain, nausea or vomiting this morning.  Tolerating clears.  Passing flatus.  Had some clear/liquidy drainage from her bottom but no true BM.  Objective: Vital signs in last 24 hours: Temp:  [98 F (36.7 C)-99 F (37.2 C)] 98.2 F (36.8 C) (06/21 0442) Pulse Rate:  [57-73] 57 (06/21 0440) Resp:  [16] 16 (06/21 0442) BP: (117-121)/(62-75) 117/75 (06/21 0442) SpO2:  [95 %-100 %] 96 % (06/21 0442) Last BM Date: 07/08/20  Intake/Output from previous day: 06/20 0701 - 06/21 0700 In: 4642.2 [P.O.:1200; I.V.:3442.2] Out: 2155 [Urine:2150; Blood:5] Intake/Output this shift: Total I/O In: -  Out: 400 [Urine:400]  PE: Gen:  Alert, NAD, pleasant Card:  Reg Pulm: CTA. Normal rate and effort Abd: Soft, NT/ND, +BS Psych: A&Ox3 Skin: no rashes noted, warm and dry   Lab Results:  Recent Labs    07/08/20 0030 07/09/20 0347  WBC 11.3* 6.9  HGB 13.2 10.8*  HCT 40.2 32.9*  PLT 349 250   BMET Recent Labs    07/08/20 0030 07/09/20 0347  NA 140 140  K 3.2* 3.6  CL 107 112*  CO2 25 24  GLUCOSE 105* 113*  BUN 13 6  CREATININE 1.01* 0.92  CALCIUM 8.5* 7.9*   PT/INR No results for input(s): LABPROT, INR in the last 72 hours. CMP     Component Value Date/Time   NA 140 07/09/2020 0347   K 3.6 07/09/2020 0347   CL 112 (H) 07/09/2020 0347   CO2 24 07/09/2020 0347   GLUCOSE 113 (H) 07/09/2020 0347   BUN 6 07/09/2020 0347   CREATININE 0.92 07/09/2020 0347   CREATININE 0.90 10/03/2012 0926   CALCIUM 7.9 (L) 07/09/2020 0347   PROT 7.6 07/08/2020 0030   ALBUMIN 3.9 07/08/2020 0030   AST 22 07/08/2020 0030   ALT 19 07/08/2020 0030   ALKPHOS 67 07/08/2020 0030   BILITOT 0.7 07/08/2020 0030   GFRNONAA >60 07/09/2020 0347   GFRAA >60 05/25/2018 2032   Lipase     Component Value Date/Time   LIPASE 27 07/08/2020 0030       Studies/Results: DG Abd 1 View  Result Date: 07/08/2020 CLINICAL DATA:   55 year old female status post endoscopic decompression of sigmoid volvulus. EXAM: ABDOMEN - 1 VIEW COMPARISON:  CT Abdomen and Pelvis 0205 hours. FINDINGS: Portable AP supine view at 0830 hours. Improved bowel gas pattern from 0205 hours today, with residual gas distended redundant sigmoid measuring 8-9 mm diameter. Splenic flexure is still gas distended. But the descending and mid transverse colon are no longer distended. Stable dystrophic calcification in the left hemipelvis and excreted IV contrast now in the urinary bladder. Negative visible right lung base. No acute osseous abnormality identified. IMPRESSION: Improved bowel gas pattern from 0205 hours today, residual distension of the sigmoid and splenic flexure, but the upstream colon is now largely normal. Electronically Signed   By: Odessa Fleming M.D.   On: 07/08/2020 08:59   CT ABDOMEN PELVIS W CONTRAST  Result Date: 07/08/2020 CLINICAL DATA:  Acute nonlocalized abdominal pain. Pt presents with 2 day hx of abd pain. Pt with IBS-C and pseudo-obstruction. States her last BM was 6/16. Pt denies vomiting. no other complaints EXAM: CT ABDOMEN AND PELVIS WITH CONTRAST TECHNIQUE: Multidetector CT imaging of the abdomen and pelvis was performed using the standard protocol following bolus administration of intravenous contrast. CONTRAST:  OMNIPAQUE IOHEXOL 300 MG/ML  SOLN COMPARISON:  CT abdomen pelvis  04/11/2013. FINDINGS: Lower chest: No acute abnormality. Hepatobiliary: No focal liver abnormality. No gallstones, gallbladder wall thickening, or pericholecystic fluid. No biliary dilatation. Pancreas: No focal lesion. Normal pancreatic contour. No surrounding inflammatory changes. No main pancreatic ductal dilatation. Spleen: Normal in size without focal abnormality. Adrenals/Urinary Tract: No adrenal nodule bilaterally. Bilateral kidneys enhance symmetrically. No hydronephrosis. No hydroureter. The urinary bladder is unremarkable. Stomach/Bowel: There is  swirling of the mesentery within the left lower abdomen with a double beak sign associated with the sigmoid colon (proximal sigmoid colon transition point on coronal image 49, distal sigmoid colon transition point on coronal image 50). The sigmoid colon is distended with gas measuring up to 8 cm within associated air-fluid level (2:9, 5:69). Stomach is within normal limits. No evidence of small bowel wall thickening or dilatation. Stool is noted within the transverse colon. The rectum is decompressed. No pneumatosis. Appendix appears normal. Vascular/Lymphatic: No significant vascular findings are present. No enlarged abdominal or pelvic lymph nodes. Reproductive: Uterus and bilateral adnexa are unremarkable. Other: Redemonstration nonspecific left pelvic 2 cm calcification (2:72). Several phleboliths also noted within the pelvis. Trace free fluid within the pelvis. No organized fluid collection. No pneumoperitoneum. Musculoskeletal: No abdominal wall hernia or abnormality. No suspicious lytic or blastic osseous lesions. No acute displaced fracture. IMPRESSION: Findings suggestive of a sigmoid volvulus. These results were called by telephone at the time of interpretation on 07/08/2020 at 3:11 am to provider [facility] , who verbally acknowledged these results. Electronically Signed   By: Tish Frederickson M.D.   On: 07/08/2020 03:12   DG Abdomen Acute W/Chest  Result Date: 07/08/2020 CLINICAL DATA:  2 day hx of abd pain. Pt with IBS-C and pseudo-obstruction. EXAM: DG ABDOMEN ACUTE WITH 1 VIEW CHEST COMPARISON:  x ray abdomen 04/30/2017, CT abdomen pelvis 04/11/2048 FINDINGS: The heart size and mediastinal contours are within normal limits. Elevated left hemidiaphragm. Left base atelectasis. No focal consolidation. No pulmonary edema. No pleural effusion. No pneumothorax. Lead pipe appearance of the left colon. Gaseous dilatation of the large bowel. Stool overlies the pelvis. No evidence of free intraperitoneal  air. Similar-appearing chronic 2.9 cm calcific density within the left pelvis. Several phleboliths are noted overlying the pelvis. No acute osseous abnormality. IMPRESSION: 1. Gaseous dilatation of the large bowel with appearance of the left colon consistent with inflammatory bowel disease. If a CT abdomen pelvis is clinically indicated, please obtain with intravenous contrast. 2. Limited evaluation for free intraperitoneal gas. 3. No acute cardiopulmonary disease. Electronically Signed   By: Tish Frederickson M.D.   On: 07/08/2020 00:57    Anti-infectives: Anti-infectives (From admission, onward)    Start     Dose/Rate Route Frequency Ordered Stop   07/09/20 1400  neomycin (MYCIFRADIN) tablet 1,000 mg       See Hyperspace for full Linked Orders Report.   1,000 mg Oral 3 times per day 07/09/20 0721 07/10/20 1359   07/09/20 1400  metroNIDAZOLE (FLAGYL) tablet 1,000 mg       See Hyperspace for full Linked Orders Report.   1,000 mg Oral 3 times per day 07/09/20 0721 07/10/20 1359   07/09/20 0815  cefoTEtan (CEFOTAN) 2 g in sodium chloride 0.9 % 100 mL IVPB        2 g 200 mL/hr over 30 Minutes Intravenous On call to O.R. 07/09/20 0721 07/10/20 0559        Assessment/Plan Sigmoid volvulus  - S/p detorsion by Dr. Marca Ancona of GI 6/20 - Plan for bowel prep today  -  Will plan for laparoscopic assisted sigmoid colectomy, possible exploratory laparotomy tomorrow  FEN - CLD, npo AM per ERAS protocol VTE - SCDs, subq heparin pre-op ID - Cefotetan on call to OR. Neomycin/Flagyl prep   LOS: 1 day    Jacinto Halim , [facility] 07/09/2020, 9:35 AM Please see Amion for pager number during day hours 7:00am-4:30pm

## 2020-07-09 NOTE — Progress Notes (Signed)
PROGRESS NOTE    Crystal MouldingRhonda K Mason  OZH:[id number]:[id number] DOB: 1965/04/27 DOA: 07/07/2020 PCP: Georgianne Fickamachandran, Ajith, MD  Outpatient Specialists: none    Brief Narrative:  Crystal Mason is a 55 y.o. female with medical history significant for ibs with chronic constipation who presents with the above.   [facility]. Experienced several days worsening abdominal pain, began as left sided but became generalized, associated with abdominal distention. No vomiting or diarrhea. Also with constipation with last BM on 6/16. She reports regular bowel movements with use of her home meds.   [facility] showed signs of sigmoid volvulus. GI was consulted who [facility] with decompression which was performed just prior to my evaluation.   Patient reports resolution of her abdominal pain and distention with colonoscopy. She says she currently feels well.   Assessment & Plan:   Principal Problem:   Sigmoid volvulus (HCC) Active Problems:   Hypothyroidism   IBS (irritable bowel syndrome)   Chronic constipation  # acute sigmoid volvulus Patient's history of chronic constipation likely contributory. Is now s/p endoscopic decompression and patient's symptoms have resolved. - GI has signed off, advises normokalemia to improve gut transit - gen surg following, plan for bowel prep today, sigmoid colectomy tomorrow   # Hypokalemia Likely 2/2 reduced PO as ov late. K 3.2 - f/u Mg - kcl 20 meq w/ IVF   # Chronic constipation # IBS - home trulance and motegrity on hold   # Hypothyroid - resume home synthroid    DVT prophylaxis: SCDs, heparin ordered by surgery prior to surgery Code Status: full Family Communication: husband updated @ bedside 6/21  Level of care: Med-Surg Status is: Observation, will speak with UR about status   Dispo: The patient is from: Home              Anticipated d/c is to: Home              Patient currently is not medically stable  to d/c.   Difficult to place patient No        Consultants:  Gi, gen surg  Procedures: colonoscopy  Antimicrobials:  none    Subjective: This morning feeling well, no abd pain or distention, has had small amount of liquid stool, tolerating fluids  Objective: Vitals:   07/08/20 2047 07/09/20 0059 07/09/20 0440 07/09/20 0442  BP: 119/62 118/68 117/75 117/75  Pulse: 63 68 (!) 57   Resp: 16 16 16 16   Temp: 98.7 F (37.1 C) 99 F (37.2 C) 98.6 F (37 C) 98.2 F (36.8 C)  TempSrc: Oral Oral Oral Oral  SpO2: 100% 95% 98% 96%  Weight:      Height:        Intake/Output Summary (Last 24 hours) at 07/09/2020 1129 Last data filed at 07/09/2020 1000 Gross per 24 hour  Intake 3642.24 ml  Output 2950 ml  Net 692.24 ml   Filed Weights   07/07/20 2313  Weight: 72.6 kg    Examination:  General exam: Appears calm and comfortable  Respiratory system: Clear to auscultation. Respiratory effort normal. Cardiovascular system: S1 & S2 heard, RRR. No JVD, murmurs, rubs, gallops or clicks. No pedal edema. Gastrointestinal system: Abdomen is nondistended, soft and nontender. No organomegaly or masses felt. Normal bowel sounds heard. Central nervous system: Alert and oriented. No focal neurological deficits. Extremities: Symmetric 5 x 5 power. Skin: No rashes, lesions or ulcers Psychiatry: Judgement and insight appear normal. Mood & affect  appropriate.     Data Reviewed: I have personally reviewed following labs and imaging studies  CBC: Recent Labs  Lab 07/08/20 0030 07/09/20 0347  WBC 11.3* 6.9  NEUTROABS 9.1*  --   HGB 13.2 10.8*  HCT 40.2 32.9*  MCV 94.6 96.8  PLT 349 250   Basic Metabolic Panel: Recent Labs  Lab 07/08/20 0030 07/08/20 0901 07/09/20 0347  NA 140  --  140  K 3.2*  --  3.6  CL 107  --  112*  CO2 25  --  24  GLUCOSE 105*  --  113*  BUN 13  --  6  CREATININE 1.01*  --  0.92  CALCIUM 8.5*  --  7.9*  MG  --  1.9 1.9   GFR: Estimated  Creatinine Clearance: 68.9 mL/min (by C-G formula based on SCr of 0.92 mg/dL). Liver Function Tests: Recent Labs  Lab 07/08/20 0030  AST 22  ALT 19  ALKPHOS 67  BILITOT 0.7  PROT 7.6  ALBUMIN 3.9   Recent Labs  Lab 07/08/20 0030  LIPASE 27   No results for input(s): AMMONIA in the last 168 hours. Coagulation Profile: No results for input(s): INR, PROTIME in the last 168 hours. Cardiac Enzymes: No results for input(s): CKTOTAL, CKMB, CKMBINDEX, TROPONINI in the last 168 hours. BNP (last 3 results) No results for input(s): PROBNP in the last 8760 hours. HbA1C: No results for input(s): HGBA1C in the last 72 hours. CBG: No results for input(s): GLUCAP in the last 168 hours. Lipid Profile: No results for input(s): CHOL, HDL, LDLCALC, TRIG, CHOLHDL, LDLDIRECT in the last 72 hours. Thyroid Function Tests: No results for input(s): TSH, T4TOTAL, FREET4, T3FREE, THYROIDAB in the last 72 hours. Anemia Panel: No results for input(s): VITAMINB12, FOLATE, FERRITIN, TIBC, IRON, RETICCTPCT in the last 72 hours. Urine analysis:    Component Value Date/Time   BILIRUBINUR neg 03/23/2012 1536   PROTEINUR neg 03/23/2012 1536   UROBILINOGEN 0.2 03/23/2012 1536   NITRITE neg 03/23/2012 1536   LEUKOCYTESUR Negative 03/23/2012 1536   Sepsis Labs: @LABRCNTIP (procalcitonin:4,lacticidven:4)  ) Recent Results (from the past 240 hour(s))  Resp Panel by RT-PCR (Flu A&B, Covid) Nasopharyngeal Swab     Status: None   Collection Time: 07/08/20  3:43 AM   Specimen: Nasopharyngeal Swab; Nasopharyngeal(NP) swabs in vial transport medium  Result Value Ref Range Status   SARS Coronavirus 2 by RT PCR NEGATIVE NEGATIVE Final    Comment: (NOTE) SARS-CoV-2 target nucleic acids are NOT DETECTED.  The SARS-CoV-2 RNA is generally detectable in upper respiratory specimens during the acute phase of infection. The lowest concentration of SARS-CoV-2 viral copies this assay can detect is 138 copies/mL. A  negative result does not preclude SARS-Cov-2 infection and should not be used as the sole basis for treatment or other patient management decisions. A negative result may occur with  improper specimen collection/handling, submission of specimen other than nasopharyngeal swab, presence of viral mutation(s) within the areas targeted by this assay, and inadequate number of viral copies(<138 copies/mL). A negative result must be combined with clinical observations, patient history, and epidemiological information. The expected result is Negative.  Fact Sheet for Patients:  07/10/20  Fact Sheet for Healthcare Providers:  BloggerCourse.com  This test is no t yet approved or cleared by the [place] FDA and  has been authorized for detection and/or diagnosis of SARS-CoV-2 by FDA under an Emergency Use Authorization (EUA). This EUA will remain  in effect (meaning this test can be  used) for the duration of the COVID-19 declaration under Section 564(b)(1) of the Act, 21 U.S.C.section 360bbb-3(b)(1), unless the authorization is terminated  or revoked sooner.       Influenza A by PCR NEGATIVE NEGATIVE Final   Influenza B by PCR NEGATIVE NEGATIVE Final    Comment: (NOTE) The Xpert Xpress SARS-CoV-2/FLU/RSV plus assay is intended as an aid in the diagnosis of influenza from Nasopharyngeal swab specimens and should not be used as a sole basis for treatment. Nasal washings and aspirates are unacceptable for Xpert Xpress SARS-CoV-2/FLU/RSV testing.  Fact Sheet for Patients: BloggerCourse.com  Fact Sheet for Healthcare Providers: SeriousBroker.it  This test is not yet approved or cleared by the [place] FDA and has been authorized for detection and/or diagnosis of SARS-CoV-2 by FDA under an Emergency Use Authorization (EUA). This EUA will remain in effect (meaning this test can  be used) for the duration of the COVID-19 declaration under Section 564(b)(1) of the Act, 21 U.S.C. section 360bbb-3(b)(1), unless the authorization is terminated or revoked.  Performed at [facility], [address], [place], [place] [address]          Radiology Studies: DG Abd 1 View  Result Date: 07/08/2020 CLINICAL DATA:  55 year old female status post endoscopic decompression of sigmoid volvulus. EXAM: ABDOMEN - 1 VIEW COMPARISON:  CT Abdomen and Pelvis 0205 hours. FINDINGS: Portable AP supine view at 0830 hours. Improved bowel gas pattern from 0205 hours today, with residual gas distended redundant sigmoid measuring 8-9 mm diameter. Splenic flexure is still gas distended. But the descending and mid transverse colon are no longer distended. Stable dystrophic calcification in the left hemipelvis and excreted IV contrast now in the urinary bladder. Negative visible right lung base. No acute osseous abnormality identified. IMPRESSION: Improved bowel gas pattern from 0205 hours today, residual distension of the sigmoid and splenic flexure, but the upstream colon is now largely normal. Electronically Signed   By: Odessa Fleming M.D.   On: 07/08/2020 08:59   CT ABDOMEN PELVIS W CONTRAST  Result Date: 07/08/2020 CLINICAL DATA:  Acute nonlocalized abdominal pain. Pt presents with 2 day hx of abd pain. Pt with IBS-C and pseudo-obstruction. States her last BM was 6/16. Pt denies vomiting. no other complaints EXAM: CT ABDOMEN AND PELVIS WITH CONTRAST TECHNIQUE: Multidetector CT imaging of the abdomen and pelvis was performed using the standard protocol following bolus administration of intravenous contrast. CONTRAST:  OMNIPAQUE IOHEXOL 300 MG/ML  SOLN COMPARISON:  CT abdomen pelvis 04/11/2013. FINDINGS: Lower chest: No acute abnormality. Hepatobiliary: No focal liver abnormality. No gallstones, gallbladder wall thickening, or pericholecystic fluid. No biliary dilatation. Pancreas: No focal  lesion. Normal pancreatic contour. No surrounding inflammatory changes. No main pancreatic ductal dilatation. Spleen: Normal in size without focal abnormality. Adrenals/Urinary Tract: No adrenal nodule bilaterally. Bilateral kidneys enhance symmetrically. No hydronephrosis. No hydroureter. The urinary bladder is unremarkable. Stomach/Bowel: There is swirling of the mesentery within the left lower abdomen with a double beak sign associated with the sigmoid colon (proximal sigmoid colon transition point on coronal image 49, distal sigmoid colon transition point on coronal image 50). The sigmoid colon is distended with gas measuring up to 8 cm within associated air-fluid level (2:9, 5:69). Stomach is within normal limits. No evidence of small bowel wall thickening or dilatation. Stool is noted within the transverse colon. The rectum is decompressed. No pneumatosis. Appendix appears normal. Vascular/Lymphatic: No significant vascular findings are present. No enlarged abdominal or pelvic lymph nodes. Reproductive: Uterus  and bilateral adnexa are unremarkable. Other: Redemonstration nonspecific left pelvic 2 cm calcification (2:72). Several phleboliths also noted within the pelvis. Trace free fluid within the pelvis. No organized fluid collection. No pneumoperitoneum. Musculoskeletal: No abdominal wall hernia or abnormality. No suspicious lytic or blastic osseous lesions. No acute displaced fracture. IMPRESSION: Findings suggestive of a sigmoid volvulus. These results were called by telephone at the time of interpretation on 07/08/2020 at 3:11 am to provider [facility] , who verbally acknowledged these results. Electronically Signed   By: Tish Frederickson M.D.   On: 07/08/2020 03:12   DG Abdomen Acute W/Chest  Result Date: 07/08/2020 CLINICAL DATA:  2 day hx of abd pain. Pt with IBS-C and pseudo-obstruction. EXAM: DG ABDOMEN ACUTE WITH 1 VIEW CHEST COMPARISON:  x ray abdomen 04/30/2017, CT abdomen pelvis 04/11/2048  FINDINGS: The heart size and mediastinal contours are within normal limits. Elevated left hemidiaphragm. Left base atelectasis. No focal consolidation. No pulmonary edema. No pleural effusion. No pneumothorax. Lead pipe appearance of the left colon. Gaseous dilatation of the large bowel. Stool overlies the pelvis. No evidence of free intraperitoneal air. Similar-appearing chronic 2.9 cm calcific density within the left pelvis. Several phleboliths are noted overlying the pelvis. No acute osseous abnormality. IMPRESSION: 1. Gaseous dilatation of the large bowel with appearance of the left colon consistent with inflammatory bowel disease. If a CT abdomen pelvis is clinically indicated, please obtain with intravenous contrast. 2. Limited evaluation for free intraperitoneal gas. 3. No acute cardiopulmonary disease. Electronically Signed   By: Tish Frederickson M.D.   On: 07/08/2020 00:57        Scheduled Meds:  acetaminophen  1,000 mg Oral On Call to OR   alvimopan  12 mg Oral On Call to OR   bupivacaine liposome  20 mL Infiltration Once   Chlorhexidine Gluconate Cloth  6 each Topical Once   And   Chlorhexidine Gluconate Cloth  6 each Topical Once   cycloSPORINE  1 drop Both Eyes BID   [START ON 07/10/2020] feeding supplement  296 mL Oral Once   feeding supplement  592 mL Oral Once   gabapentin  300 mg Oral On Call to OR   [START ON 07/10/2020] heparin  5,000 Units Subcutaneous Once   levothyroxine  100 mcg Oral QAC breakfast   neomycin  1,000 mg Oral 3 times per day   And   metroNIDAZOLE  1,000 mg Oral 3 times per day   norethindrone-ethinyl estradiol  1 tablet Oral Daily   polyethylene glycol powder  1 Container Oral Once   Continuous Infusions:  cefoTEtan (CEFOTAN) IV     dextrose 5 % and 0.45 % NaCl with KCl 20 mEq/L 125 mL/hr at 07/09/20 0837     LOS: 1 day    Time spent: 30 min    Silvano Bilis, MD [facility]   If 7PM-7AM, please contact  night-coverage [email or web address] Password [facility] 07/09/2020, 11:29 AM

## 2020-07-09 NOTE — Consult Note (Signed)
A user error has taken place: encounter opened in error, closed for administrative reasons.   Encounter was created in error. IAM is moving information over into the correct patient. Discard Encounter.

## 2020-07-10 ENCOUNTER — Encounter (HOSPITAL_COMMUNITY): Payer: Self-pay | Admitting: Obstetrics and Gynecology

## 2020-07-10 ENCOUNTER — Inpatient Hospital Stay (HOSPITAL_COMMUNITY): Payer: 59 | Admitting: Anesthesiology

## 2020-07-10 ENCOUNTER — Encounter (HOSPITAL_COMMUNITY)
Admission: EM | Disposition: A | Discharge status: Home / Self Care | Payer: Self-pay | Source: Home / Self Care | Attending: Family Medicine

## 2020-07-10 DIAGNOSIS — E039 Hypothyroidism, unspecified: Secondary | ICD-10-CM

## 2020-07-10 DIAGNOSIS — K562 Volvulus: Principal | ICD-10-CM

## 2020-07-10 HISTORY — PX: LAPAROSCOPY: SHX197

## 2020-07-10 HISTORY — PX: COLON RESECTION SIGMOID: SHX6737

## 2020-07-10 LAB — CBC WITH DIFFERENTIAL/PLATELET
Abs Immature Granulocytes: 0.01 10*3/uL (ref 0.00–0.07)
Basophils Absolute: 0 10*3/uL (ref 0.0–0.1)
Basophils Relative: 1 %
Eosinophils Absolute: 0.1 10*3/uL (ref 0.0–0.5)
Eosinophils Relative: 1 %
HCT: 35.4 % — ABNORMAL LOW (ref 36.0–46.0)
Hemoglobin: 11.6 g/dL — ABNORMAL LOW (ref 12.0–15.0)
Immature Granulocytes: 0 %
Lymphocytes Relative: 38 %
Lymphs Abs: 2.3 10*3/uL (ref 0.7–4.0)
MCH: 31.4 pg (ref 26.0–34.0)
MCHC: 32.8 g/dL (ref 30.0–36.0)
MCV: 95.9 fL (ref 80.0–100.0)
Monocytes Absolute: 0.5 10*3/uL (ref 0.1–1.0)
Monocytes Relative: 8 %
Neutro Abs: 3.1 10*3/uL (ref 1.7–7.7)
Neutrophils Relative %: 52 %
Platelets: 277 10*3/uL (ref 150–400)
RBC: 3.69 MIL/uL — ABNORMAL LOW (ref 3.87–5.11)
RDW: 13.3 % (ref 11.5–15.5)
WBC: 5.9 10*3/uL (ref 4.0–10.5)
nRBC: 0 % (ref 0.0–0.2)

## 2020-07-10 LAB — BASIC METABOLIC PANEL
Anion gap: 6 (ref 5–15)
BUN: 6 mg/dL (ref 6–20)
CO2: 24 mmol/L (ref 22–32)
Calcium: 8.5 mg/dL — ABNORMAL LOW (ref 8.9–10.3)
Chloride: 108 mmol/L (ref 98–111)
Creatinine, Ser: 0.74 mg/dL (ref 0.44–1.00)
GFR, Estimated: >60 mL/min (ref >60)
Glucose, Bld: 109 mg/dL — ABNORMAL HIGH (ref 70–99)
Potassium: 4.1 mmol/L (ref 3.5–5.1)
Sodium: 138 mmol/L (ref 135–145)

## 2020-07-10 SURGERY — LAPAROSCOPY, DIAGNOSTIC
Anesthesia: General | Site: Abdomen

## 2020-07-10 MED ORDER — BUPIVACAINE-EPINEPHRINE 0.25% -1:200000 IJ SOLN
INTRAMUSCULAR | Status: DC | PRN
  Administered 2020-07-10: 30 mL

## 2020-07-10 MED ORDER — MIDAZOLAM HCL 2 MG/2ML IJ SOLN
INTRAMUSCULAR | Status: AC
  Filled 2020-07-10: qty 2

## 2020-07-10 MED ORDER — ACETAMINOPHEN 500 MG PO TABS
1000.0000 mg | ORAL_TABLET | Freq: Four times a day (QID) | ORAL | Status: DC
  Administered 2020-07-10 – 2020-07-14 (×15): 1000 mg via ORAL
  Filled 2020-07-10 (×16): qty 2

## 2020-07-10 MED ORDER — OXYCODONE HCL 5 MG PO TABS: 5.0000 mg | ORAL_TABLET | Freq: Once | ORAL | Status: DC | PRN

## 2020-07-10 MED ORDER — HYDRALAZINE HCL 20 MG/ML IJ SOLN: 10.0000 mg | INTRAMUSCULAR | Status: DC | PRN

## 2020-07-10 MED ORDER — ACETAMINOPHEN 10 MG/ML IV SOLN: 1000.0000 mg | Freq: Once | INTRAVENOUS | Status: DC | PRN

## 2020-07-10 MED ORDER — SODIUM CHLORIDE 0.9 % IV SOLN: INTRAVENOUS | Status: DC

## 2020-07-10 MED ORDER — BUPIVACAINE-EPINEPHRINE (PF) 0.25% -1:200000 IJ SOLN
INTRAMUSCULAR | Status: AC
  Filled 2020-07-10: qty 30

## 2020-07-10 MED ORDER — ROCURONIUM BROMIDE 10 MG/ML (PF) SYRINGE
PREFILLED_SYRINGE | INTRAVENOUS | Status: DC | PRN
  Administered 2020-07-10: 60 mg via INTRAVENOUS
  Administered 2020-07-10: 20 mg via INTRAVENOUS

## 2020-07-10 MED ORDER — HYDROMORPHONE HCL 1 MG/ML IJ SOLN
0.2500 mg | INTRAMUSCULAR | Status: DC | PRN
  Administered 2020-07-10: 0.5 mg via INTRAVENOUS

## 2020-07-10 MED ORDER — MIDAZOLAM HCL 2 MG/2ML IJ SOLN
INTRAMUSCULAR | Status: DC | PRN
  Administered 2020-07-10: 2 mg via INTRAVENOUS

## 2020-07-10 MED ORDER — DIPHENHYDRAMINE HCL 50 MG/ML IJ SOLN: 25.0000 mg | Freq: Four times a day (QID) | INTRAMUSCULAR | Status: DC | PRN

## 2020-07-10 MED ORDER — LIDOCAINE 2% (20 MG/ML) 5 ML SYRINGE
INTRAMUSCULAR | Status: DC | PRN
  Administered 2020-07-10: 100 mg via INTRAVENOUS

## 2020-07-10 MED ORDER — ONDANSETRON HCL 4 MG/2ML IJ SOLN
INTRAMUSCULAR | Status: DC | PRN
  Administered 2020-07-10: 4 mg via INTRAVENOUS

## 2020-07-10 MED ORDER — SACCHAROMYCES BOULARDII 250 MG PO CAPS
250.0000 mg | ORAL_CAPSULE | Freq: Two times a day (BID) | ORAL | Status: DC
  Administered 2020-07-10 – 2020-07-14 (×8): 250 mg via ORAL
  Filled 2020-07-10 (×8): qty 1

## 2020-07-10 MED ORDER — ONDANSETRON HCL 4 MG/2ML IJ SOLN: 4.0000 mg | Freq: Once | INTRAMUSCULAR | Status: DC | PRN

## 2020-07-10 MED ORDER — FENTANYL CITRATE (PF) 250 MCG/5ML IJ SOLN
INTRAMUSCULAR | Status: AC
  Filled 2020-07-10: qty 5

## 2020-07-10 MED ORDER — AMISULPRIDE (ANTIEMETIC) 5 MG/2ML IV SOLN: 10.0000 mg | Freq: Once | INTRAVENOUS | Status: DC | PRN

## 2020-07-10 MED ORDER — OXYCODONE HCL 5 MG/5ML PO SOLN: 5.0000 mg | Freq: Once | ORAL | Status: DC | PRN

## 2020-07-10 MED ORDER — MORPHINE SULFATE (PF) 2 MG/ML IV SOLN
2.0000 mg | INTRAVENOUS | Status: DC | PRN
Start: 2020-07-10 — End: 2020-07-14

## 2020-07-10 MED ORDER — EPHEDRINE 5 MG/ML INJ
INTRAVENOUS | Status: AC
  Filled 2020-07-10: qty 10

## 2020-07-10 MED ORDER — FENTANYL CITRATE (PF) 100 MCG/2ML IJ SOLN
INTRAMUSCULAR | Status: AC
  Administered 2020-07-10: 50 ug via INTRAVENOUS
  Filled 2020-07-10: qty 4

## 2020-07-10 MED ORDER — ENSURE SURGERY PO LIQD
237.0000 mL | Freq: Two times a day (BID) | ORAL | Status: DC
  Administered 2020-07-12 – 2020-07-14 (×3): 237 mL via ORAL

## 2020-07-10 MED ORDER — SIMETHICONE 80 MG PO CHEW: 40.0000 mg | CHEWABLE_TABLET | Freq: Four times a day (QID) | ORAL | Status: DC | PRN

## 2020-07-10 MED ORDER — LIDOCAINE 2% (20 MG/ML) 5 ML SYRINGE
INTRAMUSCULAR | Status: AC
  Filled 2020-07-10: qty 5

## 2020-07-10 MED ORDER — DEXAMETHASONE SODIUM PHOSPHATE 10 MG/ML IJ SOLN
INTRAMUSCULAR | Status: DC | PRN
  Administered 2020-07-10: 10 mg via INTRAVENOUS

## 2020-07-10 MED ORDER — ONDANSETRON HCL 4 MG PO TABS
4.0000 mg | ORAL_TABLET | Freq: Four times a day (QID) | ORAL | Status: DC | PRN
  Administered 2020-07-12: 4 mg via ORAL
  Filled 2020-07-10: qty 1

## 2020-07-10 MED ORDER — HYDROMORPHONE HCL 1 MG/ML IJ SOLN
INTRAMUSCULAR | Status: AC
  Administered 2020-07-10: 0.5 mg via INTRAVENOUS
  Filled 2020-07-10: qty 1

## 2020-07-10 MED ORDER — FENTANYL CITRATE (PF) 100 MCG/2ML IJ SOLN
INTRAMUSCULAR | Status: DC | PRN
  Administered 2020-07-10: 100 ug via INTRAVENOUS

## 2020-07-10 MED ORDER — DEXAMETHASONE SODIUM PHOSPHATE 10 MG/ML IJ SOLN
INTRAMUSCULAR | Status: AC
  Filled 2020-07-10: qty 1

## 2020-07-10 MED ORDER — ROCURONIUM BROMIDE 10 MG/ML (PF) SYRINGE
PREFILLED_SYRINGE | INTRAVENOUS | Status: AC
  Filled 2020-07-10: qty 10

## 2020-07-10 MED ORDER — DIPHENHYDRAMINE HCL 25 MG PO CAPS: 25.0000 mg | ORAL_CAPSULE | Freq: Four times a day (QID) | ORAL | Status: DC | PRN

## 2020-07-10 MED ORDER — LACTATED RINGERS IV SOLN: INTRAVENOUS | Status: DC

## 2020-07-10 MED ORDER — ENOXAPARIN SODIUM 40 MG/0.4ML IJ SOSY
40.0000 mg | PREFILLED_SYRINGE | INTRAMUSCULAR | Status: DC
  Administered 2020-07-11 – 2020-07-14 (×4): 40 mg via SUBCUTANEOUS
  Filled 2020-07-10 (×4): qty 0.4

## 2020-07-10 MED ORDER — ONDANSETRON HCL 4 MG/2ML IJ SOLN
INTRAMUSCULAR | Status: AC
  Filled 2020-07-10: qty 2

## 2020-07-10 MED ORDER — KETAMINE HCL 10 MG/ML IJ SOLN
INTRAMUSCULAR | Status: DC | PRN
  Administered 2020-07-10: 10 mg via INTRAVENOUS
  Administered 2020-07-10: 20 mg via INTRAVENOUS

## 2020-07-10 MED ORDER — PROPOFOL 10 MG/ML IV BOLUS
INTRAVENOUS | Status: DC | PRN
  Administered 2020-07-10: 140 mg via INTRAVENOUS

## 2020-07-10 MED ORDER — MELATONIN 3 MG PO TABS: 3.0000 mg | ORAL_TABLET | Freq: Every evening | ORAL | Status: DC | PRN

## 2020-07-10 MED ORDER — FENTANYL CITRATE (PF) 100 MCG/2ML IJ SOLN
25.0000 ug | INTRAMUSCULAR | Status: DC | PRN
  Administered 2020-07-10 (×2): 50 ug via INTRAVENOUS

## 2020-07-10 MED ORDER — BUPIVACAINE LIPOSOME 1.3 % IJ SUSP
20.0000 mL | Freq: Once | INTRAMUSCULAR | Status: AC
  Administered 2020-07-10: 20 mL
  Filled 2020-07-10: qty 20

## 2020-07-10 MED ORDER — KETAMINE HCL 10 MG/ML IJ SOLN
INTRAMUSCULAR | Status: AC
  Filled 2020-07-10: qty 1

## 2020-07-10 MED ORDER — CHLORHEXIDINE GLUCONATE CLOTH 2 % EX PADS
6.0000 | MEDICATED_PAD | Freq: Every day | CUTANEOUS | Status: DC
  Administered 2020-07-11 – 2020-07-13 (×3): 6 via TOPICAL

## 2020-07-10 MED ORDER — OXYCODONE HCL 5 MG PO TABS
5.0000 mg | ORAL_TABLET | ORAL | Status: DC | PRN
  Administered 2020-07-10 – 2020-07-14 (×8): 10 mg via ORAL
  Filled 2020-07-10 (×8): qty 2

## 2020-07-10 MED ORDER — ALVIMOPAN 12 MG PO CAPS
12.0000 mg | ORAL_CAPSULE | Freq: Two times a day (BID) | ORAL | Status: DC
  Administered 2020-07-11 – 2020-07-12 (×3): 12 mg via ORAL
  Filled 2020-07-10 (×4): qty 1

## 2020-07-10 MED ORDER — EPHEDRINE SULFATE-NACL 50-0.9 MG/10ML-% IV SOSY
PREFILLED_SYRINGE | INTRAVENOUS | Status: DC | PRN
  Administered 2020-07-10: 10 mg via INTRAVENOUS

## 2020-07-10 MED ORDER — PROPOFOL 10 MG/ML IV BOLUS
INTRAVENOUS | Status: AC
  Filled 2020-07-10: qty 20

## 2020-07-10 MED ORDER — ONDANSETRON HCL 4 MG/2ML IJ SOLN: 4.0000 mg | Freq: Four times a day (QID) | INTRAMUSCULAR | Status: DC | PRN

## 2020-07-10 MED ORDER — 0.9 % SODIUM CHLORIDE (POUR BTL) OPTIME
TOPICAL | Status: DC | PRN
  Administered 2020-07-10 (×4): 1000 mL

## 2020-07-10 SURGICAL SUPPLY — 86 items
APPLIER CLIP 5 13 M/L LIGAMAX5 (MISCELLANEOUS)
APPLIER CLIP ROT 10 11.4 M/L (STAPLE)
BAG COUNTER SPONGE SURGICOUNT (BAG) IMPLANT
BAG SURGICOUNT SPONGE COUNTING (BAG)
BLADE EXTENDED COATED 6.5IN (ELECTRODE) ×4 IMPLANT
CELLS DAT CNTRL 66122 CELL SVR (MISCELLANEOUS) IMPLANT
CHLORAPREP W/TINT 26 (MISCELLANEOUS) ×4 IMPLANT
CLIP APPLIE 5 13 M/L LIGAMAX5 (MISCELLANEOUS) IMPLANT
CLIP APPLIE ROT 10 11.4 M/L (STAPLE) IMPLANT
CLIP VESOCCLUDE LG 6/CT (CLIP) IMPLANT
COUNTER NEEDLE 20 DBL MAG RED (NEEDLE) ×4 IMPLANT
COVER MAYO STAND STRL (DRAPES) ×12 IMPLANT
COVER SURGICAL LIGHT HANDLE (MISCELLANEOUS) ×7 IMPLANT
DRAIN CHANNEL 19F RND (DRAIN) IMPLANT
DRAPE LAPAROSCOPIC ABDOMINAL (DRAPES) ×1 IMPLANT
DRSG OPSITE POSTOP 4X6 (GAUZE/BANDAGES/DRESSINGS) IMPLANT
DRSG OPSITE POSTOP 4X8 (GAUZE/BANDAGES/DRESSINGS) ×3 IMPLANT
DRSG TEGADERM 2-3/8X2-3/4 SM (GAUZE/BANDAGES/DRESSINGS) ×6 IMPLANT
DRSG TELFA 3X8 NADH (GAUZE/BANDAGES/DRESSINGS) IMPLANT
ELECT REM PT RETURN 15FT ADLT (MISCELLANEOUS) ×4 IMPLANT
EVACUATOR SILICONE 100CC (DRAIN) ×1 IMPLANT
GAUZE SPONGE 2X2 8PLY STRL LF (GAUZE/BANDAGES/DRESSINGS) ×1 IMPLANT
GAUZE SPONGE 4X4 12PLY STRL (GAUZE/BANDAGES/DRESSINGS) ×1 IMPLANT
GLOVE SURG POLY ORTHO LF SZ7.5 (GLOVE) ×8 IMPLANT
GOWN STRL REUS W/TWL LRG LVL3 (GOWN DISPOSABLE) ×6 IMPLANT
GOWN STRL REUS W/TWL XL LVL3 (GOWN DISPOSABLE) ×18 IMPLANT
KIT TURNOVER KIT A (KITS) ×4 IMPLANT
LEGGING LITHOTOMY PAIR STRL (DRAPES) IMPLANT
LIGASURE IMPACT 36 18CM CVD LR (INSTRUMENTS) ×3 IMPLANT
NS IRRIG 1000ML POUR BTL (IV SOLUTION) ×8 IMPLANT
PACK COLON (CUSTOM PROCEDURE TRAY) ×4 IMPLANT
PACK GENERAL/GYN (CUSTOM PROCEDURE TRAY) ×1 IMPLANT
PAD DRESSING TELFA 3X8 NADH (GAUZE/BANDAGES/DRESSINGS) IMPLANT
PAD POSITIONING PINK XL (MISCELLANEOUS) ×3 IMPLANT
PENCIL SMOKE EVACUATOR (MISCELLANEOUS) IMPLANT
PROTECTOR NERVE ULNAR (MISCELLANEOUS) ×3 IMPLANT
RELOAD PROXIMATE 75MM BLUE (ENDOMECHANICALS) ×4 IMPLANT
RELOAD STAPLE 75 3.8 BLU REG (ENDOMECHANICALS) IMPLANT
RETRACTOR WND ALEXIS 18 MED (MISCELLANEOUS) IMPLANT
RETRACTOR WND ALEXIS 25 LRG (MISCELLANEOUS) ×1 IMPLANT
RTRCTR WOUND ALEXIS 18CM MED (MISCELLANEOUS)
RTRCTR WOUND ALEXIS 25CM LRG (MISCELLANEOUS) ×4
SEPRAFILM MEMBRANE 5X6 (MISCELLANEOUS) IMPLANT
SEPRAFILM PROCEDURAL PACK 3X5 (MISCELLANEOUS) IMPLANT
SET IRRIG TUBING LAPAROSCOPIC (IRRIGATION / IRRIGATOR) ×1 IMPLANT
SET TUBE SMOKE EVAC HIGH FLOW (TUBING) ×4 IMPLANT
SHEARS FOC LG CVD HARMONIC 17C (MISCELLANEOUS) IMPLANT
SHEARS HARMONIC ACE PLUS 36CM (ENDOMECHANICALS) ×1 IMPLANT
SLEEVE XCEL OPT CAN 5 100 (ENDOMECHANICALS) ×5 IMPLANT
SPONGE GAUZE 2X2 STER 10/PKG (GAUZE/BANDAGES/DRESSINGS) ×2
SPONGE LAP 18X18 RF (DISPOSABLE) ×3 IMPLANT
SPONGE T-LAP 18X18 ~~LOC~~+RFID (SPONGE) IMPLANT
STAPLER CVD CUT BL 40 RELOAD (ENDOMECHANICALS) ×4 IMPLANT
STAPLER CVD CUT BLU 40 RELOAD (ENDOMECHANICALS) IMPLANT
STAPLER ECHELON POWER CIR 29 (STAPLE) ×3 IMPLANT
STAPLER PROXIMATE 75MM BLUE (STAPLE) ×3 IMPLANT
STAPLER VISISTAT 35W (STAPLE) ×4 IMPLANT
SUT ETHILON 2 0 PS N (SUTURE) IMPLANT
SUT PDS AB 0 CTX 60 (SUTURE) IMPLANT
SUT PDS AB 1 CTX 36 (SUTURE) IMPLANT
SUT PDS AB 1 TP1 96 (SUTURE) ×6 IMPLANT
SUT PDS AB 3-0 SH 27 (SUTURE) ×1 IMPLANT
SUT PDS AB 4-0 SH 27 (SUTURE) IMPLANT
SUT PROLENE 2 0 BLUE (SUTURE) IMPLANT
SUT PROLENE 2 0 KS (SUTURE) ×3 IMPLANT
SUT SILK 2 0 (SUTURE) ×4
SUT SILK 2 0 SH CR/8 (SUTURE) ×5 IMPLANT
SUT SILK 2 0SH CR/8 30 (SUTURE) IMPLANT
SUT SILK 2-0 18XBRD TIE 12 (SUTURE) ×3 IMPLANT
SUT SILK 2-0 30XBRD TIE 12 (SUTURE) IMPLANT
SUT SILK 3 0 (SUTURE) ×4
SUT SILK 3 0 SH CR/8 (SUTURE) ×8 IMPLANT
SUT SILK 3-0 18XBRD TIE 12 (SUTURE) ×3 IMPLANT
SUT VIC AB 1 CTX 18 (SUTURE) IMPLANT
SUT VIC AB 3-0 SH 18 (SUTURE) IMPLANT
SYS LAPSCP GELPORT 120MM (MISCELLANEOUS)
SYSTEM LAPSCP GELPORT 120MM (MISCELLANEOUS) IMPLANT
TAPE UMBILICAL 1/8 X36 TWILL (MISCELLANEOUS) IMPLANT
TOWEL OR 17X26 10 PK STRL BLUE (TOWEL DISPOSABLE) ×1 IMPLANT
TOWEL OR NON WOVEN STRL DISP B (DISPOSABLE) ×1 IMPLANT
TRAY FOLEY MTR SLVR 14FR STAT (SET/KITS/TRAYS/PACK) ×3 IMPLANT
TRAY FOLEY MTR SLVR 16FR STAT (SET/KITS/TRAYS/PACK) ×1 IMPLANT
TRAY PREP A LATEX SAFE STRL (SET/KITS/TRAYS/PACK) ×3 IMPLANT
TROCAR BLADELESS OPT 5 100 (ENDOMECHANICALS) ×4 IMPLANT
TROCAR XCEL 12X100 BLDLESS (ENDOMECHANICALS) IMPLANT
TROCAR XCEL NON-BLD 11X100MML (ENDOMECHANICALS) IMPLANT

## 2020-07-10 NOTE — Anesthesia Procedure Notes (Addendum)
Procedure Name: Intubation Date/Time: 07/10/2020 12:02 PM Performed by: Pearson Grippe, CRNA Pre-anesthesia Checklist: Patient identified, Emergency Drugs available, Suction available and Patient being monitored Patient Re-evaluated:Patient Re-evaluated prior to induction Oxygen Delivery Method: Circle system utilized Preoxygenation: Pre-oxygenation with 100% oxygen Induction Type: IV induction Ventilation: Mask ventilation without difficulty Laryngoscope Size: Miller and 2 Grade View: Grade II Tube type: Oral Tube size: 7.0 mm Number of attempts: 1 Airway Equipment and Method: Stylet Placement Confirmation: ETT inserted through vocal cords under direct vision, positive ETCO2 and breath sounds checked- equal and bilateral Secured at: 22 cm Tube secured with: Tape Dental Injury: Teeth and Oropharynx as per pre-operative assessment

## 2020-07-10 NOTE — Interval H&P Note (Signed)
History and Physical Interval Note:  07/10/2020 10:51 AM  Crystal Mason  has presented today for surgery, with the diagnosis of SIGMOID VOLVULUS.  The various methods of treatment have been discussed with the patient and family. After consideration of risks, benefits and other options for treatment, the patient has consented to  Procedure(s): LAPAROSCOPY DIAGNOSTIC COLON RESECTION SIGMOID (N/A) as a surgical intervention.  The patient's history has been reviewed, patient examined, no change in status, stable for surgery.  I have reviewed the patient's chart and labs.  Questions were answered to the patient's satisfaction.    All questions asked and answered Enhanced recovery protocol Preoperative subcutaneous heparin IV antibiotic 2 OR for diagnostic laparoscopy, sigmoid colon resection Gaynelle Adu

## 2020-07-10 NOTE — Op Note (Signed)
07/07/2020 - 07/10/2020  2:31 PM  PATIENT:  Crystal Mason  55 y.o. female  PRE-OPERATIVE DIAGNOSIS:  SIGMOID VOLVULUS  POST-OPERATIVE DIAGNOSIS:  SIGMOID VOLVULUS  PROCEDURE:  Procedure(s): LAPAROSCOPY DIAGNOSTIC SIGMOID COLECTOMY LAPAROSCOPIC BILATERAL TAP BLOCK  RIGID proctoscopy  SURGEON:  Surgeon(s): Gaynelle Adu, MD  ASSISTANTS: Leary Roca PA-C   ANESTHESIA:   general  EBL: <20 cc  DRAINS: Urinary Catheter (Foley)   LOCAL MEDICATIONS USED:  MARCAINE    and OTHER exparel  SPECIMEN:  Source of Specimen:  sigmoid colon  DISPOSITION OF SPECIMEN:  PATHOLOGY  COUNTS:  YES  INDICATION FOR PROCEDURE: Patient is a 55 year old female who presented a few days ago with worsening abdominal pain.  Radiographs and CT imaging was concerning for sigmoid volvulus.  She has a chronic history of constipation and is on 2 motility agents.  She underwent colonoscopy by gastroenterology and was excessively detorsed.  Surgery was consulted to discuss sigmoid volvulus.  I discussed the high risk of recurrence with sigmoid volvulus.  I recommended sigmoid colectomy.  I discussed the risk and benefits including but not limited to bleeding, infection, injury to surrounding structures, anastomotic stricture, anastomotic leak, wound infection, incisional hernia, perioperative cardiac and pulmonary events, blood clot formation.  We discussed the typical recovery.  We discussed the very rare chance of death.  We discussed the typical recovery.  We discussed that generally these procedures are easier to do open just because of this year redundancy and chronic dilation of that part of the colon.  PROCEDURE: Patient was given oral Tylenol, gabapentin and Entereg prior to surgery.  She was given 5000 units of subcutaneous heparin.  IV antibiotic was administered.  She was taken the OR for at [facility] placed supine on the operating room table.  General endotracheal anesthesia was established.   Sequential compression devices were placed.  A Foley catheter was placed.  She was placed in the lithotomy position with the appropriate padding.  Her abdomen was prepped and draped with ChloraPrep.  Her perineum and perianal area were prepped with Betadine.  Surgical timeout was performed.  Optical entry was obtained in the right upper quadrant just 1 fingerbreadth below the right subcostal margin in the midclavicular line with a 0 degree 5 mm laparoscope.  The abdominal cavity was entered.  There is no evidence of injury to surrounding structures.  There was greatest dilation of a large segment of sigmoid colon which was coming up out of the left lower quadrant up toward the right upper quadrant and then back down to the pelvis.  There is also evidence of chronic cecal distention.  As expected the sheer redundancy of the colon was not amendable to laparoscopic intervention so therefore I performed a bilateral laparoscopic tap block for postoperative pain relief.  We then converted to open.  A lower midline incision was made with a 10 blade.  Subcutaneous tissue was divided and fascia was entered with electrocautery.  A wound protector was placed.  The grossly redundant chronically dilated sigmoid colon was delivered from the abdomen.  The cecum was identified and also had chronic dilatation.  Small bowel appeared normal.  The transverse colon had a little bit of dilation to it but there is clearly a segment of her entire sigmoid colon that was chronically dilated.  Identified the rectosigmoid junction.  Identified the area of the descending colon that would easily reach the rectosigmoid junction.  I made a small defect in the mesentery in the proximal sigmoid  colon where was of normal caliber.  I then divided the colon with a GIA stapler with a 75 Miller blue load.  We then started taking down the mesentery staying close to the colon with LigaSure device.  Interrupted 2-0 silk sutures were placed as needed for  hemostasis.  A small window was made in the mesentery just at the rectosigmoid junction.  I then divided the rectosigmoid junction with a blue load contour stapler.  We then ended up finishing up dividing the mesentery of the sigmoid colon with LigaSure device.  The sigmoid colon was passed off the field.  The descending colon reached the rectal stump.  A pursestring clamp was placed just below the proximal colon staple line.  2-0 Prolene on a Keith needle was used to make a pursestring suture.  The staple line was excised.  I then sized the proximal colon with sizers.  It easily accommodated a 29 mm dilator.  We then opened up a 29 mm EEA stapler.  The anvil was placed into the proximal colon after the staple line had been excised.  The pursestring suture was tied down the securing the anvil into the proximal colon.  I ensured hemostasis of the transected mesentery.  I also made sure that the proximal descending colon mesentery was aligned and in proper alignment for anastomosis to the rectum.  I then went below why the PA stayed at the bedside.  I advanced a dilator up to anus up the rectum up to the rectal stump.  It easily advanced.  I then performed a rigid proctoscopy and there was some liquid stool in the rectal vault.  But again I was able to advance the rigid proctoscopy to the end of the rectal stump.  I then took the 29 mm Ethicon stapler placed in the rectum and advanced it all the way to the end of the rectal stump.  I was able to visualize this myself.  I then opened up the stapler and advanced the spike just anterior to the staple line in the midportion.  The PA then mated the anvil to the spike a snap was heard and then we closed down the stapler to approximate the 2 minutes to do a functional end-to-end anastomosis.  Compression was held.  The stapler was then fired.  The stapler was removed and there were 2 circumferential intact donuts.  I then performed a rigid proctoscopy again and insufflated  the rectum the anastomosis distended followed flooding the pelvis with saline and there is no bubbles.  The rigid proctoscope was removed.  We then went to clean protocol and removed the current instruments and placed new drapes and suction catheter and Bovie catheter.  All staff changed gown and gloves.  The wound protector had been removed.  I reinspected the anastomosis myself.  I placed 4 interrupted 3-0 silk sutures across the anterior wall of the anastomosis.  I then placed remaining Exparel in the fascia in the lower midline.  We then closed the fascia with a running looped #1 PDS 1 from above 1 from below.  I then placed the laparoscope back in the abdomen inflated the abdomen.  There was nothing trapped within the lower midline closure.  There was no air leak.  The laparoscope was removed and pneumoperitoneum was released.  We irrigated the lower midline wound.  Skin was reapproximated with skin staples.  The trocar site was also closed with skin staples.  A honeycomb dressing was placed in the lower midline  and a Tegaderm on the optical entry site.  All needle, instrument, and sponge counts were correct x2.  There were no immediate complications patient tolerated procedure well.  She was extubated and taken to recovery in stable condition  PLAN OF CARE: Admit to inpatient   PATIENT DISPOSITION:  PACU - hemodynamically stable.   Delay start of Pharmacological VTE agent (>24hrs) due to surgical blood loss or risk of bleeding:  no  Mary Sella. Andrey Campanile, MD, FACS General, Bariatric, & Minimally Invasive Surgery [facility], [place]

## 2020-07-10 NOTE — Progress Notes (Signed)
PROGRESS NOTE  Crystal Mason:[id number] DOB: 1965/07/31 DOA: 07/07/2020 PCP: Georgianne Fick, MD  Brief History   55 year old woman presented with abdominal pain.  Found to have sigmoid volvulus.  A & P  Acute sigmoid volvulus status post endoscopic decompression --Now status post sigmoid colectomy --Management per surgery  Hypothyroidism --Resume Synthroid  Disposition Plan:  Discussion:   Status is: Inpatient  Remains inpatient appropriate because:Inpatient level of care appropriate due to severity of illness  Dispo: The patient is from: Home              Anticipated d/c is to: Home              Patient currently is not medically stable to d/c.   Difficult to place patient No  DVT prophylaxis: enoxaparin (LOVENOX) injection 40 mg Start: 07/11/20 0800 SCD's Start: 07/10/20 1714 Place TED hose Start: 07/10/20 1714 SCD's Start: 07/09/20 8119   Code Status: Full Code Level of care: Med-Surg Family Communication: husband at bedside  [name], MD  [facility] Direct contact: see [email or web address] (further directions at bottom of note if needed) 7PM-7AM contact night coverage as at bottom of note 07/10/2020, 6:05 PM  LOS: 2 days     Consults:  GI General surgery   Procedures:  Colonoscopy - Preparation of the colon was poor.                           - Dilated in the sigmoid colon, in the descending                           colon and in the transverse colon. Decompression                           performed. LAPAROSCOPY DIAGNOSTIC SIGMOID COLECTOMY LAPAROSCOPIC BILATERAL TAP BLOCK  RIGID proctoscopy  Interval History/Subjective  CC: f/u abd pain  Feels ok s/p surgery  Review of Systems  Respiratory:  Negative for shortness of breath.    Objective   Vitals:  Vitals:   07/10/20 1645 07/10/20 1710  BP:  136/86  Pulse:  78  Resp:  18  Temp: (P) 98.2 F (36.8 C) 98.4 F (36.9 C)  SpO2:  95%    Exam: Physical Exam Vitals reviewed.   Constitutional:      Appearance: Normal appearance.  Cardiovascular:     Rate and Rhythm: Normal rate and regular rhythm.     Heart sounds: No murmur heard.   No gallop.  Pulmonary:     Effort: Pulmonary effort is normal. No respiratory distress.     Breath sounds: Normal breath sounds. No wheezing or rales.  Neurological:     Mental Status: She is alert.  Psychiatric:        Mood and Affect: Mood normal.        Behavior: Behavior normal.    I have personally reviewed the labs and other data, making special note of:   Today's Data  BMP and CBC stable   Scheduled Meds:  acetaminophen  1,000 mg Oral Q6H   [START ON 07/11/2020] alvimopan  12 mg Oral BID   cycloSPORINE  1 drop Both Eyes BID   [START ON 07/11/2020] enoxaparin (LOVENOX) injection  40 mg Subcutaneous Q24H   [START ON 07/11/2020] feeding supplement  237 mL Oral BID BM   levothyroxine  100 mcg Oral QAC  breakfast   norethindrone-ethinyl estradiol  1 tablet Oral Daily   saccharomyces boulardii  250 mg Oral BID   Continuous Infusions:  sodium chloride 100 mL/hr at 07/10/20 1727    Principal Problem:   Sigmoid volvulus (HCC) Active Problems:   Hypothyroidism   IBS (irritable bowel syndrome)   Chronic constipation   LOS: 2 days   How to contact the [facility] Attending or Consulting provider 7A - 7P or covering provider during after hours 7P -7A, for this patient?  Check the care team in [facility] and look for a) attending/consulting TRH provider listed and b) the [facility] team listed Log into [email or web address] and use [place]'s universal password to access. If you do not have the password, please contact the hospital operator. Locate the [facility] provider you are looking for under [facility] and page to a number that you can be directly reached. If you still have difficulty reaching the provider, please page the [facility] (Director on Call) for the Hospitalists listed on amion for assistance.

## 2020-07-10 NOTE — Hospital Course (Addendum)
55 year old woman presented with abdominal pain.  Found to have sigmoid volvulus.  Status post sigmoid colectomy 6/22.  Awaiting return of bowel function and anticipate discharge home.  Has left shoulder pain of unclear etiology favored benign.

## 2020-07-10 NOTE — Transfer of Care (Signed)
Immediate Anesthesia Transfer of Care Note  Patient: Crystal Mason  Procedure(s) Performed: LAPAROSCOPY DIAGNOSTIC (Abdomen) COLON RESECTION SIGMOID (Abdomen)  Patient Location: PACU  Anesthesia Type:General  Level of Consciousness: drowsy  Airway & Oxygen Therapy: Patient Spontanous Breathing and Patient connected to face mask oxygen  Post-op Assessment: Report given to RN and Post -op Vital signs reviewed and stable  Post vital signs: Reviewed and stable  Last Vitals:  Vitals Value Taken Time  BP 127/66 07/10/20 1445  Temp    Pulse 60 07/10/20 1447  Resp 15 07/10/20 1447  SpO2 100 % 07/10/20 1447  Vitals shown include unvalidated device data.  Last Pain:  Vitals:   07/10/20 0952  TempSrc:   PainSc: 0-No pain         Complications: No notable events documented.

## 2020-07-11 ENCOUNTER — Encounter (HOSPITAL_COMMUNITY): Payer: Self-pay | Admitting: General Surgery

## 2020-07-11 LAB — CBC
HCT: 36.6 % (ref 36.0–46.0)
Hemoglobin: 12 g/dL (ref 12.0–15.0)
MCH: 31.3 pg (ref 26.0–34.0)
MCHC: 32.8 g/dL (ref 30.0–36.0)
MCV: 95.3 fL (ref 80.0–100.0)
Platelets: 290 10*3/uL (ref 150–400)
RBC: 3.84 MIL/uL — ABNORMAL LOW (ref 3.87–5.11)
RDW: 13.4 % (ref 11.5–15.5)
WBC: 16.1 10*3/uL — ABNORMAL HIGH (ref 4.0–10.5)
nRBC: 0 % (ref 0.0–0.2)

## 2020-07-11 LAB — BASIC METABOLIC PANEL
Anion gap: 9 (ref 5–15)
BUN: 6 mg/dL (ref 6–20)
CO2: 22 mmol/L (ref 22–32)
Calcium: 8.1 mg/dL — ABNORMAL LOW (ref 8.9–10.3)
Chloride: 106 mmol/L (ref 98–111)
Creatinine, Ser: 0.83 mg/dL (ref 0.44–1.00)
GFR, Estimated: >60 mL/min (ref >60)
Glucose, Bld: 108 mg/dL — ABNORMAL HIGH (ref 70–99)
Potassium: 4.2 mmol/L (ref 3.5–5.1)
Sodium: 137 mmol/L (ref 135–145)

## 2020-07-11 MED ORDER — POLYETHYLENE GLYCOL 3350 17 G PO PACK
17.0000 g | PACK | Freq: Every day | ORAL | Status: DC
  Administered 2020-07-11 – 2020-07-14 (×4): 17 g via ORAL
  Filled 2020-07-11 (×4): qty 1

## 2020-07-11 NOTE — Anesthesia Postprocedure Evaluation (Signed)
Anesthesia Post Note  Patient: Crystal Mason  Procedure(s) Performed: LAPAROSCOPY DIAGNOSTIC (Abdomen) COLON RESECTION SIGMOID (Abdomen)     Patient location during evaluation: PACU Anesthesia Type: General Level of consciousness: awake and alert Pain management: pain level controlled Vital Signs Assessment: post-procedure vital signs reviewed and stable Respiratory status: spontaneous breathing, nonlabored ventilation, respiratory function stable and patient connected to nasal cannula oxygen Cardiovascular status: blood pressure returned to baseline and stable Postop Assessment: no apparent nausea or vomiting Anesthetic complications: no   No notable events documented.  Last Vitals:  Vitals:   07/11/20 0446 07/11/20 1350  BP: 133/65 133/67  Pulse: 74 70  Resp: 17 18  Temp: 36.9 C 36.6 C  SpO2: 97% 100%    Last Pain:  Vitals:   07/11/20 1442  TempSrc:   PainSc: 4    Pain Goal: Patients Stated Pain Goal: 3 (07/11/20 0915)                 Trevor Iha

## 2020-07-11 NOTE — Progress Notes (Signed)
PROGRESS NOTE  Crystal Mason YNW:[id number] DOB: June 19, 1965 DOA: 07/07/2020 PCP: Georgianne Fick, MD  Brief History   55 year old woman presented with abdominal pain.  Found to have sigmoid volvulus.  Status post sigmoid colectomy 6/22.  Awaiting return of bowel function and anticipate discharge home.    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 Management per surgery  Hypothyroidism --continue levothyroxine  Disposition Plan:  Discussion:   Status is: Inpatient  Remains inpatient appropriate because:Inpatient level of care appropriate due to severity of illness  Dispo: The patient is from: Home              Anticipated d/c is to: Home              Patient currently is not medically stable to d/c.   Difficult to place patient No  DVT prophylaxis: enoxaparin (LOVENOX) injection 40 mg Start: 07/11/20 0800 SCD's Start: 07/10/20 1714 Place TED hose Start: 07/10/20 1714 SCD's Start: 07/09/20 0722   Code Status: Full Code Level of care: Med-Surg Family Communication: husband at bedside 6/23  [name], MD  [facility] Direct contact: see [email or web address] (further directions at bottom of note if needed) 7PM-7AM contact night coverage as at bottom of note 07/11/2020, 2:41 PM  LOS: 3 days     Consults:  GI General surgery   Procedures:  Colonoscopy - Preparation of the colon was poor.                           - Dilated in the sigmoid colon, in the descending                           colon and in the transverse colon. Decompression                           performed. LAPAROSCOPY DIAGNOSTIC SIGMOID COLECTOMY LAPAROSCOPIC BILATERAL TAP BLOCK  RIGID proctoscopy  Interval History/Subjective  CC: f/u abd pain  Feels ok today  Review of Systems  Respiratory:  Negative for shortness of breath.   Gastrointestinal:  Negative for abdominal pain, nausea and vomiting.   Objective   Vitals:  Vitals:   07/11/20 0446 07/11/20  1350  BP: 133/65 133/67  Pulse: 74 70  Resp: 17 18  Temp: 98.4 F (36.9 C) 97.9 F (36.6 C)  SpO2: 97% 100%    Exam: Physical Exam Vitals and nursing note reviewed.  Constitutional:      General: She is not in acute distress.    Appearance: She is not toxic-appearing.  Cardiovascular:     Rate and Rhythm: Normal rate and regular rhythm.     Heart sounds: No murmur heard.   No gallop.  Pulmonary:     Effort: Pulmonary effort is normal. No respiratory distress.     Breath sounds: Normal breath sounds. No wheezing.  Abdominal:     Palpations: Abdomen is soft.  Neurological:     Mental Status: She is alert.  Psychiatric:        Mood and Affect: Mood normal.        Behavior: Behavior normal.    I have personally reviewed the labs and other data, making special note of:   Today's Data  BMP and WBC 16.1   Scheduled Meds:  acetaminophen  1,000 mg Oral Q6H   alvimopan  12 mg  Oral BID   Chlorhexidine Gluconate Cloth  6 each Topical Daily   cycloSPORINE  1 drop Both Eyes BID   enoxaparin (LOVENOX) injection  40 mg Subcutaneous Q24H   feeding supplement  237 mL Oral BID BM   levothyroxine  100 mcg Oral QAC breakfast   norethindrone-ethinyl estradiol  1 tablet Oral Daily   polyethylene glycol  17 g Oral Daily   saccharomyces boulardii  250 mg Oral BID   Continuous Infusions:  sodium chloride Stopped (07/11/20 0515)    Principal Problem:   Sigmoid volvulus (HCC) Active Problems:   Hypothyroidism   IBS (irritable bowel syndrome)   Chronic constipation   LOS: 3 days   How to contact the [facility] Attending or Consulting provider 7A - 7P or covering provider during after hours 7P -7A, for this patient?  Check the care team in [facility] and look for a) attending/consulting TRH provider listed and b) the [facility] team listed Log into [email or web address] and use [place]'s universal password to access. If you do not have the password, please contact the hospital operator. Locate the [facility]  provider you are looking for under [facility] and page to a number that you can be directly reached. If you still have difficulty reaching the provider, please page the [facility] (Director on Call) for the Hospitalists listed on amion for assistance.

## 2020-07-11 NOTE — Progress Notes (Signed)
1 Day Post-Op  Subjective: CC: Patient reports she is doing well.  Some soreness around her midline incision is well controlled with medications.  She is out of bed and in the chair this morning.  Has mobilized already.  Foley out this morning and already voided a small amount.  Tolerating clears without nausea or vomiting.  Just advanced to full liquid diet but has not had any yet.  No flatus or BM.  Objective: Vital signs in last 24 hours: Temp:  [97.6 F (36.4 C)-98.5 F (36.9 C)] 98.4 F (36.9 C) (06/23 0446) Pulse Rate:  [54-78] 74 (06/23 0446) Resp:  [9-22] 17 (06/23 0446) BP: (111-138)/(65-86) 133/65 (06/23 0446) SpO2:  [91 %-100 %] 97 % (06/23 0446) Weight:  [72.6 kg-73.4 kg] 73.4 kg (06/23 0500) Last BM Date: 07/10/20  Intake/Output from previous day: 06/22 0701 - 06/23 0700 In: 3281.2 [P.O.:300; I.V.:2881.2; IV Piggyback:100] Out: 2375 [Urine:2325; Blood:50] Intake/Output this shift: No intake/output data recorded.  PE: Gen:  Alert, NAD, pleasant Card:  Reg Pulm:  Normal rate and effort  Abd: Soft, ND, appropriately tender around midline incision, +BS, laparoscopic incision site with staples in place c/d/I. Midline would with honeycomb dressing in place, c/d/i Ext:  No LE edema  Psych: A&Ox3  Skin: no rashes noted, warm and dry   Lab Results:  Recent Labs    07/10/20 0414 07/11/20 0459  WBC 5.9 16.1*  HGB 11.6* 12.0  HCT 35.4* 36.6  PLT 277 290   BMET Recent Labs    07/10/20 0414 07/11/20 0459  NA 138 137  K 4.1 4.2  CL 108 106  CO2 24 22  GLUCOSE 109* 108*  BUN 6 6  CREATININE 0.74 0.83  CALCIUM 8.5* 8.1*   PT/INR No results for input(s): LABPROT, INR in the last 72 hours. CMP     Component Value Date/Time   NA 137 07/11/2020 0459   K 4.2 07/11/2020 0459   CL 106 07/11/2020 0459   CO2 22 07/11/2020 0459   GLUCOSE 108 (H) 07/11/2020 0459   BUN 6 07/11/2020 0459   CREATININE 0.83 07/11/2020 0459   CREATININE 0.90 10/03/2012 0926    CALCIUM 8.1 (L) 07/11/2020 0459   PROT 7.6 07/08/2020 0030   ALBUMIN 3.9 07/08/2020 0030   AST 22 07/08/2020 0030   ALT 19 07/08/2020 0030   ALKPHOS 67 07/08/2020 0030   BILITOT 0.7 07/08/2020 0030   GFRNONAA >60 07/11/2020 0459   GFRAA >60 05/25/2018 2032   Lipase     Component Value Date/Time   LIPASE 27 07/08/2020 0030       Studies/Results: No results found.  Anti-infectives: Anti-infectives (From admission, onward)    Start     Dose/Rate Route Frequency Ordered Stop   07/10/20 0600  cefoTEtan (CEFOTAN) 2 g in sodium chloride 0.9 % 100 mL IVPB        2 g 200 mL/hr over 30 Minutes Intravenous On call to O.R. 07/09/20 0721 07/10/20 1235   07/09/20 1400  neomycin (MYCIFRADIN) tablet 1,000 mg       See Hyperspace for full Linked Orders Report.   1,000 mg Oral 3 times per day 07/09/20 0721 07/09/20 2149   07/09/20 1400  metroNIDAZOLE (FLAGYL) tablet 1,000 mg       See Hyperspace for full Linked Orders Report.   1,000 mg Oral 3 times per day 07/09/20 2951 07/09/20 2148        Assessment/Plan POD 1, s/p diagnostic laparoscopy, X exploratory laparotomy,  sigmoid colectomy for sigmoid volvulus, Dr. Andrey Campanile, 6/22 - Keep on FLD till ROBF - Entereg till ROBF - D/c Foley today - Will discuss with Gi about restarting some of her motility agents she took at home. Will order miralax now - Am labs - Mobilize - Pulm toilet - Path pending    FEN - FLD, IVF per TRH,  VTE - SCDs, Loveonx ID - Neomycin/Flagyl prep. Cefotetan peri-op. None currently. WBC 16.1. Afebrile.  Foley - d/c today  Hypothyroidism   LOS: 3 days    Jacinto Halim , [facility] 07/11/2020, 9:17 AM Please see Amion for pager number during day hours 7:00am-4:30pm

## 2020-07-11 NOTE — Assessment & Plan Note (Signed)
continue levothyroxine

## 2020-07-11 NOTE — Assessment & Plan Note (Addendum)
--  s/p sigmoid colectomy 6/23 -- Improving.  Bowels moving.  Tolerating soft diet.  Management per surgery.

## 2020-07-12 LAB — SURGICAL PATHOLOGY

## 2020-07-12 LAB — BASIC METABOLIC PANEL
Anion gap: 6 (ref 5–15)
BUN: 7 mg/dL (ref 6–20)
CO2: 26 mmol/L (ref 22–32)
Calcium: 8.4 mg/dL — ABNORMAL LOW (ref 8.9–10.3)
Chloride: 106 mmol/L (ref 98–111)
Creatinine, Ser: 0.86 mg/dL (ref 0.44–1.00)
GFR, Estimated: >60 mL/min (ref >60)
Glucose, Bld: 86 mg/dL (ref 70–99)
Potassium: 3.6 mmol/L (ref 3.5–5.1)
Sodium: 138 mmol/L (ref 135–145)

## 2020-07-12 LAB — CBC
HCT: 36.4 % (ref 36.0–46.0)
Hemoglobin: 11.8 g/dL — ABNORMAL LOW (ref 12.0–15.0)
MCH: 31.3 pg (ref 26.0–34.0)
MCHC: 32.4 g/dL (ref 30.0–36.0)
MCV: 96.6 fL (ref 80.0–100.0)
Platelets: 264 10*3/uL (ref 150–400)
RBC: 3.77 MIL/uL — ABNORMAL LOW (ref 3.87–5.11)
RDW: 13.7 % (ref 11.5–15.5)
WBC: 11.3 10*3/uL — ABNORMAL HIGH (ref 4.0–10.5)
nRBC: 0 % (ref 0.0–0.2)

## 2020-07-12 NOTE — Progress Notes (Signed)
PROGRESS NOTE  Crystal Mason ZDG:[id number] DOB: May 07, 1965 DOA: 07/07/2020 PCP: Georgianne Fick, MD  Brief History   55 year old woman presented with abdominal pain.  Found to have sigmoid volvulus.  Status post sigmoid colectomy 6/22.  Awaiting return of bowel function and anticipate discharge home.    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 Management per surgery, continues on full liquids.  Hypothyroidism --continue levothyroxine  Disposition Plan:  Discussion:   Status is: Inpatient  Remains inpatient appropriate because:Inpatient level of care appropriate due to severity of illness  Dispo: The patient is from: Home              Anticipated d/c is to: Home              Patient currently is not medically stable to d/c.   Difficult to place patient No  DVT prophylaxis: enoxaparin (LOVENOX) injection 40 mg Start: 07/11/20 0800 SCD's Start: 07/10/20 1714 Place TED hose Start: 07/10/20 1714 SCD's Start: 07/09/20 0722   Code Status: Full Code Level of care: Med-Surg Family Communication: husband at bedside 6/23  [name], MD  [facility] Direct contact: see [email or web address] (further directions at bottom of note if needed) 7PM-7AM contact night coverage as at bottom of note 07/12/2020, 9:59 AM  LOS: 4 days     Consults:  GI General surgery   Procedures:  Colonoscopy - Preparation of the colon was poor.                           - Dilated in the sigmoid colon, in the descending                           colon and in the transverse colon. Decompression                           performed. LAPAROSCOPY DIAGNOSTIC SIGMOID COLECTOMY LAPAROSCOPIC BILATERAL TAP BLOCK  RIGID proctoscopy  Interval History/Subjective  CC: f/u abd pain  Feels better, some abd pain No BM   Review of Systems  Respiratory:  Negative for shortness of breath.   Gastrointestinal:  Negative for nausea and vomiting.  Musculoskeletal:   Positive for myalgias (left shoulder since surgery).   Objective   Vitals:  Vitals:   07/11/20 2054 07/12/20 0456  BP: 135/79 (!) 143/76  Pulse: (!) 58 72  Resp: 16 17  Temp: 98.1 F (36.7 C) 98.2 F (36.8 C)  SpO2: (!) 84% 96%    Exam: Physical Exam Vitals and nursing note reviewed.  Constitutional:      Appearance: Normal appearance.  Cardiovascular:     Rate and Rhythm: Normal rate and regular rhythm.     Heart sounds: No murmur heard.   No gallop.  Pulmonary:     Effort: Pulmonary effort is normal. No respiratory distress.     Breath sounds: Normal breath sounds. No wheezing or rales.  Musculoskeletal:        General: Tenderness (left shoulder soft tissue/muscle) present. No swelling or deformity. Normal range of motion.     Comments: Moves left shoulder well  Neurological:     Mental Status: She is alert.    I have personally reviewed the labs and other data, making special note of:   Today's Data  Bmp stable WBC down to 11.3   Scheduled Meds:  acetaminophen  1,000 mg Oral Q6H   alvimopan  12 mg Oral BID   Chlorhexidine Gluconate Cloth  6 each Topical Daily   cycloSPORINE  1 drop Both Eyes BID   enoxaparin (LOVENOX) injection  40 mg Subcutaneous Q24H   feeding supplement  237 mL Oral BID BM   levothyroxine  100 mcg Oral QAC breakfast   norethindrone-ethinyl estradiol  1 tablet Oral Daily   polyethylene glycol  17 g Oral Daily   saccharomyces boulardii  250 mg Oral BID   Continuous Infusions:  sodium chloride Stopped (07/11/20 0515)    Principal Problem:   Sigmoid volvulus (HCC) Active Problems:   Hypothyroidism   IBS (irritable bowel syndrome)   Chronic constipation   LOS: 4 days   How to contact the [facility] Attending or Consulting provider 7A - 7P or covering provider during after hours 7P -7A, for this patient?  Check the care team in [facility] and look for a) attending/consulting TRH provider listed and b) the [facility] team listed Log into [email or web address]  and use [facility] [place] universal password to access. If you do not have the password, please contact the hospital operator. Locate the [facility] provider you are looking for under [facility] and page to a number that you can be directly reached. If you still have difficulty reaching the provider, please page the [facility] (Director on Call) for the Hospitalists listed on amion for assistance.

## 2020-07-12 NOTE — Discharge Instructions (Signed)
[facility]      [facility] [phone]  OPEN ABDOMINAL SURGERY: POST OP INSTRUCTIONS  Always review your discharge instruction sheet given to you by the facility where your surgery was performed.  IF YOU HAVE DISABILITY OR FAMILY LEAVE FORMS, YOU MUST BRING THEM TO THE OFFICE FOR PROCESSING.  PLEASE DO NOT GIVE THEM TO YOUR DOCTOR.  A prescription for pain medication may be given to you upon discharge.  Take your pain medication as prescribed, if needed.  If narcotic pain medicine is not needed, then you may take acetaminophen (Tylenol) or ibuprofen (Advil) as needed. Take your usually prescribed medications unless otherwise directed. If you need a refill on your pain medication, please contact your pharmacy. They will contact our office to request authorization.  Prescriptions will not be filled after 5pm or on week-ends. You should follow a light diet the first few days after arrival home, such as soup and crackers, pudding, etc.unless your doctor has advised otherwise. A high-fiber, low fat diet can be resumed as tolerated.   Be sure to include lots of fluids daily. Most patients will experience some swelling and bruising on the chest and neck area.  Ice packs will help.  Swelling and bruising can take several days to resolve Most patients will experience some swelling and bruising in the area of the incision. Ice pack will help. Swelling and bruising can take several days to resolve..  It is common to experience some constipation if taking pain medication after surgery.  Increasing fluid intake and taking a stool softener will usually help or prevent this problem from occurring.  A mild laxative (Milk of Magnesia or Miralax) should be taken according to package directions if there are no bowel movements after 48 hours.  You may have steri-strips (small skin tapes) in place directly over the incision.  These strips should be left on the skin for 7-10 days.  If your surgeon used skin  glue on the incision, you may shower in 24 hours.  The glue will flake off over the next 2-3 weeks.  Any sutures or staples will be removed at the office during your follow-up visit. You may find that a light gauze bandage over your incision may keep your staples from being rubbed or pulled. You may shower and replace the bandage daily. ACTIVITIES:  You may resume regular (light) daily activities beginning the next day--such as daily self-care, walking, climbing stairs--gradually increasing activities as tolerated.  You may have sexual intercourse when it is comfortable.  Refrain from any heavy lifting or straining until approved by your doctor. You may drive when you no longer are taking prescription pain medication, you can comfortably wear a seatbelt, and you can safely maneuver your car and apply brakes Return to Work: ___________________________________ You should see your doctor in the office for a follow-up appointment approximately two weeks after your surgery.  Make sure that you call for this appointment within a day or two after you arrive home to insure a convenient appointment time. OTHER INSTRUCTIONS:  _____________________________________________________________ _____________________________________________________________  WHEN TO CALL YOUR DOCTOR: Fever over 101.0 Inability to urinate Nausea and/or vomiting Extreme swelling or bruising Continued bleeding from incision. Increased pain, redness, or drainage from the incision. Difficulty swallowing or breathing Muscle cramping or spasms. Numbness or tingling in hands or feet or around lips.  The clinic staff is available to answer your questions during regular business hours.  Please don't hesitate to call and ask to speak to one of   the nurses if you have concerns.  For further questions, please visit [email or web address]  

## 2020-07-12 NOTE — Progress Notes (Signed)
2 Days Post-Op  Subjective: CC: Doing well. Some intermittent soreness mainly around her incision that is well controlled on medications. Tolerating FLD without n/v. No flatus or bm. Mobilizing. Voiding since foley removal.   Objective: Vital signs in last 24 hours: Temp:  [97.9 F (36.6 C)-98.2 F (36.8 C)] 98.2 F (36.8 C) (06/24 0456) Pulse Rate:  [58-72] 72 (06/24 0456) Resp:  [16-18] 17 (06/24 0456) BP: (133-143)/(67-79) 143/76 (06/24 0456) SpO2:  [84 %-100 %] 96 % (06/24 0456) Last BM Date: 07/10/20  Intake/Output from previous day: 06/23 0701 - 06/24 0700 In: 260 [P.O.:260] Out: 2690 [Urine:2690] Intake/Output this shift: No intake/output data recorded.  PE: Gen:  Alert, NAD, pleasant Card:  Reg Pulm:  Normal rate and effort  Abd: Soft, ND, appropriately tender around midline incision, +BS, laparoscopic incision site with staples in place c/d/I. Midline would with honeycomb dressing in place, c/d/i Ext:  No LE edema  Psych: A&Ox3 Skin: no rashes noted, warm and dry  Lab Results:  Recent Labs    07/11/20 0459 07/12/20 0408  WBC 16.1* 11.3*  HGB 12.0 11.8*  HCT 36.6 36.4  PLT 290 264   BMET Recent Labs    07/11/20 0459 07/12/20 0408  NA 137 138  K 4.2 3.6  CL 106 106  CO2 22 26  GLUCOSE 108* 86  BUN 6 7  CREATININE 0.83 0.86  CALCIUM 8.1* 8.4*   PT/INR No results for input(s): LABPROT, INR in the last 72 hours. CMP     Component Value Date/Time   NA 138 07/12/2020 0408   K 3.6 07/12/2020 0408   CL 106 07/12/2020 0408   CO2 26 07/12/2020 0408   GLUCOSE 86 07/12/2020 0408   BUN 7 07/12/2020 0408   CREATININE 0.86 07/12/2020 0408   CREATININE 0.90 10/03/2012 0926   CALCIUM 8.4 (L) 07/12/2020 0408   PROT 7.6 07/08/2020 0030   ALBUMIN 3.9 07/08/2020 0030   AST 22 07/08/2020 0030   ALT 19 07/08/2020 0030   ALKPHOS 67 07/08/2020 0030   BILITOT 0.7 07/08/2020 0030   GFRNONAA >60 07/12/2020 0408   GFRAA >60 05/25/2018 2032   Lipase      Component Value Date/Time   LIPASE 27 07/08/2020 0030       Studies/Results: No results found.  Anti-infectives: Anti-infectives (From admission, onward)    Start     Dose/Rate Route Frequency Ordered Stop   07/10/20 0600  cefoTEtan (CEFOTAN) 2 g in sodium chloride 0.9 % 100 mL IVPB        2 g 200 mL/hr over 30 Minutes Intravenous On call to O.R. 07/09/20 0721 07/10/20 1235   07/09/20 1400  neomycin (MYCIFRADIN) tablet 1,000 mg       See Hyperspace for full Linked Orders Report.   1,000 mg Oral 3 times per day 07/09/20 0721 07/09/20 2149   07/09/20 1400  metroNIDAZOLE (FLAGYL) tablet 1,000 mg       See Hyperspace for full Linked Orders Report.   1,000 mg Oral 3 times per day 07/09/20 9983 07/09/20 2148        Assessment/Plan POD 2, s/p diagnostic laparoscopy, X exploratory laparotomy, sigmoid colectomy for sigmoid volvulus, Dr. Andrey Campanile, 6/22 - Keep on FLD till ROBF - Entereg till ROBF - Gi recommended holding home motility agents for 1 week and restarting if no BM. May not need anymore after surgery per GI - Cont miralax - Am labs - Mobilize - Pulm toilet - Path pending  FEN - FLD, IVF per TRH, miralax VTE - SCDs, Loveonx ID - Neomycin/Flagyl prep. Cefotetan peri-op. None currently. WBC 11.8 Afebrile. Foley - out POD 1. Voiding.    Hypothyroidism   LOS: 4 days    Jacinto Halim , [facility] 07/12/2020, 8:40 AM Please see Amion for pager number during day hours 7:00am-4:30pm

## 2020-07-13 ENCOUNTER — Inpatient Hospital Stay (HOSPITAL_COMMUNITY): Payer: 59

## 2020-07-13 DIAGNOSIS — M25512 Pain in left shoulder: Secondary | ICD-10-CM

## 2020-07-13 LAB — BASIC METABOLIC PANEL
Anion gap: 6 (ref 5–15)
BUN: 9 mg/dL (ref 6–20)
CO2: 27 mmol/L (ref 22–32)
Calcium: 8.4 mg/dL — ABNORMAL LOW (ref 8.9–10.3)
Chloride: 104 mmol/L (ref 98–111)
Creatinine, Ser: 0.88 mg/dL (ref 0.44–1.00)
GFR, Estimated: >60 mL/min (ref >60)
Glucose, Bld: 89 mg/dL (ref 70–99)
Potassium: 3.5 mmol/L (ref 3.5–5.1)
Sodium: 137 mmol/L (ref 135–145)

## 2020-07-13 LAB — CBC
HCT: 35.7 % — ABNORMAL LOW (ref 36.0–46.0)
Hemoglobin: 11.8 g/dL — ABNORMAL LOW (ref 12.0–15.0)
MCH: 31.7 pg (ref 26.0–34.0)
MCHC: 33.1 g/dL (ref 30.0–36.0)
MCV: 96 fL (ref 80.0–100.0)
Platelets: 289 10*3/uL (ref 150–400)
RBC: 3.72 MIL/uL — ABNORMAL LOW (ref 3.87–5.11)
RDW: 13.6 % (ref 11.5–15.5)
WBC: 7.9 10*3/uL (ref 4.0–10.5)
nRBC: 0 % (ref 0.0–0.2)

## 2020-07-13 MED ORDER — KETOROLAC TROMETHAMINE 30 MG/ML IJ SOLN
30.0000 mg | Freq: Four times a day (QID) | INTRAMUSCULAR | Status: DC
  Administered 2020-07-13 – 2020-07-14 (×5): 30 mg via INTRAVENOUS
  Filled 2020-07-13 (×5): qty 1

## 2020-07-13 MED ORDER — TIZANIDINE HCL 4 MG PO TABS
2.0000 mg | ORAL_TABLET | Freq: Three times a day (TID) | ORAL | Status: DC | PRN
  Administered 2020-07-13 (×2): 2 mg via ORAL
  Filled 2020-07-13 (×2): qty 1

## 2020-07-13 NOTE — Progress Notes (Signed)
3 Days Post-Op   Subjective/Chief Complaint: Patient's main complaint is her left shoulder.  This actually hurts much worse than her abdomen.  Films pending - initial viewing shows no obvious shoulder joint abnormality.  Two BM today - less distended   Objective: Vital signs in last 24 hours: Temp:  [97.8 F (36.6 C)-98.4 F (36.9 C)] 98.4 F (36.9 C) (06/25 0625) Pulse Rate:  [60-71] 60 (06/25 0625) Resp:  [16-18] 18 (06/25 0625) BP: (123-131)/(64-72) 123/72 (06/25 0625) SpO2:  [95 %-100 %] 97 % (06/25 0625) Last BM Date: 07/12/20  Intake/Output from previous day: 06/24 0701 - 06/25 0700 In: 480 [P.O.:480] Out: -  Intake/Output this shift: No intake/output data recorded.  General appearance: alert, cooperative, and no distress GI: soft, mildly tender; incision c/d/i Extremities: left shoulder - FROM; tender over trapezius extending to joint Staple line intact  Lab Results:  Recent Labs    07/12/20 0408 07/13/20 0515  WBC 11.3* 7.9  HGB 11.8* 11.8*  HCT 36.4 35.7*  PLT 264 289   BMET Recent Labs    07/12/20 0408 07/13/20 0515  NA 138 137  K 3.6 3.5  CL 106 104  CO2 26 27  GLUCOSE 86 89  BUN 7 9  CREATININE 0.86 0.88  CALCIUM 8.4* 8.4*   PT/INR No results for input(s): LABPROT, INR in the last 72 hours. ABG No results for input(s): PHART, HCO3 in the last 72 hours.  Invalid input(s): PCO2, PO2  Studies/Results: No results found.  Anti-infectives: Anti-infectives (From admission, onward)    Start     Dose/Rate Route Frequency Ordered Stop   07/10/20 0600  cefoTEtan (CEFOTAN) 2 g in sodium chloride 0.9 % 100 mL IVPB        2 g 200 mL/hr over 30 Minutes Intravenous On call to O.R. 07/09/20 0721 07/10/20 1235   07/09/20 1400  neomycin (MYCIFRADIN) tablet 1,000 mg       See Hyperspace for full Linked Orders Report.   1,000 mg Oral 3 times per day 07/09/20 0721 07/09/20 2149   07/09/20 1400  metroNIDAZOLE (FLAGYL) tablet 1,000 mg       See  Hyperspace for full Linked Orders Report.   1,000 mg Oral 3 times per day 07/09/20 6759 07/09/20 2148       Assessment/Plan: POD 3, s/p diagnostic laparoscopy, X exploratory laparotomy, sigmoid colectomy for sigmoid volvulus, Dr. Andrey Campanile, 6/22 - Advance to soft diet - D/C Entereg - Gi recommended holding home motility agents for 1 week and restarting if no BM. May not need anymore after surgery per GI - Cont miralax - Am labs - Mobilize - Pulm toilet - Path pending    FEN - FLD, IVF per TRH, miralax VTE - SCDs, Loveonx ID - Neomycin/Flagyl prep. Cefotetan peri-op. None currently. WBC 11.8 Afebrile. Foley - out POD 1. Voiding.  Shoulder pain - x-rays; add Toradol and muscle relaxant   Hypothyroidism  Possible discharge tomorrow.  LOS: 5 days    Wynona Luna 07/13/2020

## 2020-07-13 NOTE — Assessment & Plan Note (Addendum)
--   No shoulder pain prior to admission or surgery.  Had bilateral shoulder pain after surgery, right side quickly resolved, continues to have left-sided pain  -- Seems to have localized at the distal shoulder at this time.  There is no pain on examination and musculoskeletal testing is unremarkable.  She does report that when she takes a deep breath she feels a catch in her left side underneath her ribs and when she does this, she feels pain in the left shoulder.  Her abdomen remains bloated status post surgery.  Suspect she has diaphragmatic irritation causing referred pain to the left shoulder, given abdominal bloating and only pain to palpation being underneath the left ribs.  She has no pain in the shoulder itself with palpation or movement, her pain is intermittent and exacerbated by position and consistent with spasms.  She has no history of heart disease or VTE and no signs or symptoms to suggest this.  Most likely explanation is referred pain at this time.  Discussed in detail with husband, offered further testing but at this point we will proceed with conservative management.  Spontaneous improvement expected.

## 2020-07-13 NOTE — Progress Notes (Signed)
Pharmacy Brief Note - Alvimopan (Entereg)  The standing order set for alvimopan (Entereg) now includes an automatic order to discontinue the drug after the patient has had a bowel movement. The change was approved by the Pharmacy & Therapeutics Committee and the Medical Executive Committee.   This patient has had bowel movements documented by nursing. Therefore, alvimopan has been discontinued. If there are questions, please contact the pharmacy at [phone].   Thank you- Loralee Pacas, PharmD, BCPS 07/13/2020 12:11 PM

## 2020-07-13 NOTE — Progress Notes (Signed)
PROGRESS NOTE  Crystal Mason PJK:[id number] DOB: 07/14/1965 DOA: 07/07/2020 PCP: Georgianne Fick, MD  Brief History   55 year old woman presented with abdominal pain.  Found to have sigmoid volvulus.  Status post sigmoid colectomy 6/22.  Awaiting return of bowel function and anticipate discharge home.  Has left shoulder pain of unclear etiology.  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  Continue management per surgery.  Currently on liquid diet.  Anticipate home when cleared by surgery.  Left shoulder pain -- No shoulder pain prior to admission or surgery.  Had bilateral shoulder pain after surgery, right side quickly resolved, continues to have left-sided pain which seems to have worsened, certainly worse in the afternoon.  Exam is benign, strength appears intact. --Etiology and significance unclear.  Pain seems to be muscular in nature.  No deficits noted.  Will check left shoulder x-ray and trial muscle relaxers.  Suspect positional intraoperatively and may take some time to resolve.  Hypothyroidism --continue levothyroxine  Disposition Plan:  Discussion:   Status is: Inpatient  Remains inpatient appropriate because:Inpatient level of care appropriate due to severity of illness  Dispo: The patient is from: Home              Anticipated d/c is to: Home              Patient currently is not medically stable to d/c.   Difficult to place patient No  DVT prophylaxis: enoxaparin (LOVENOX) injection 40 mg Start: 07/11/20 0800 SCD's Start: 07/10/20 1714 Place TED hose Start: 07/10/20 1714 SCD's Start: 07/09/20 0722   Code Status: Full Code Level of care: Med-Surg Family Communication: husband at bedside 6/23  [name], MD  [facility] Direct contact: see [email or web address] (further directions at bottom of note if needed) 7PM-7AM contact night coverage as at bottom of note 07/13/2020, 9:09 AM  LOS: 5 days     Consults:   GI General surgery   Procedures:  Colonoscopy - Preparation of the colon was poor.                           - Dilated in the sigmoid colon, in the descending                           colon and in the transverse colon. Decompression                           performed. LAPAROSCOPY DIAGNOSTIC SIGMOID COLECTOMY LAPAROSCOPIC BILATERAL TAP BLOCK  RIGID proctoscopy  Interval History/Subjective  CC: f/u abd pain  Abd pain tolerable, tolerating liquids, small BM x2   Review of Systems  Respiratory:  Negative for shortness of breath.   Musculoskeletal:  Positive for myalgias (pain left shoulder/trap area worse yesterday, and worse than abd pain).   Neck pain bilateral trapezius, shoulder areas postsurgery, none previous.  Right-sided pain quickly resolved.  Left-sided pain has overall worsened.  No definite pain in bone.  Pain seems to be a muscle.  Not aggravated by movement.  Waxes and wanes.  At times quite painful.  Yesterday got shaky and almost dropped something in her left hand.  Objective   Vitals:  Vitals:   07/12/20 2230 07/13/20 0625  BP: 127/72 123/72  Pulse: 62 60  Resp: 16 18  Temp: 98.4 F (36.9 C) 98.4 F (36.9  C)  SpO2: 95% 97%    Exam: Physical Exam Constitutional:      Appearance: She is well-developed.  Cardiovascular:     Rate and Rhythm: Normal rate and regular rhythm.     Heart sounds: No murmur heard.   No gallop.  Pulmonary:     Effort: Pulmonary effort is normal.     Breath sounds: Normal breath sounds.  Musculoskeletal:     Comments: Left shoulder, scapular area, clavicular area, trapezius all appear unremarkable and symmetric compared to the right side.  Skin appears unremarkable.  There is full range of movement at the left shoulder.  There is no significant pain with palpation of the trapezius, clavicle, mild pain at the acromion.  Strength left arm intact and resistance to weight does not cause pain.  Holds full glass of iced tea and left  hand without shaking.  Neurological:     Mental Status: She is alert.  Psychiatric:        Mood and Affect: Mood normal.        Behavior: Behavior normal.    I have personally reviewed the labs and other data, making special note of:   Today's Data  BMP, CBC unremarkable.  WBC has normalized.  Scheduled Meds:  acetaminophen  1,000 mg Oral Q6H   alvimopan  12 mg Oral BID   Chlorhexidine Gluconate Cloth  6 each Topical Daily   cycloSPORINE  1 drop Both Eyes BID   enoxaparin (LOVENOX) injection  40 mg Subcutaneous Q24H   feeding supplement  237 mL Oral BID BM   levothyroxine  100 mcg Oral QAC breakfast   norethindrone-ethinyl estradiol  1 tablet Oral Daily   polyethylene glycol  17 g Oral Daily   saccharomyces boulardii  250 mg Oral BID   Continuous Infusions:  sodium chloride Stopped (07/11/20 0515)    Principal Problem:   Sigmoid volvulus (HCC) Active Problems:   Left shoulder pain   Hypothyroidism   IBS (irritable bowel syndrome)   Chronic constipation   LOS: 5 days   How to contact the [facility] Attending or Consulting provider 7A - 7P or covering provider during after hours 7P -7A, for this patient?  Check the care team in [facility] and look for a) attending/consulting TR[facility] provider listed and b) the [facility] team listed Log into [email or web address] and use [place]'s universal password to access. If you do not have the password, please contact the hospital operator. Locate the [facility] provider you are looking for under [facility] and page to a number that you can be directly reached. If you still have difficulty reaching the provider, please page the [facility] (Director on Call) for the Hospitalists listed on amion for assistance.

## 2020-07-14 MED ORDER — TIZANIDINE HCL 2 MG PO TABS: 2.0000 mg | ORAL_TABLET | Freq: Three times a day (TID) | ORAL | 0 refills | Status: AC | PRN

## 2020-07-14 MED ORDER — OXYCODONE HCL 5 MG PO TABS: 5.0000 mg | ORAL_TABLET | Freq: Four times a day (QID) | ORAL | 0 refills | Status: DC | PRN

## 2020-07-14 MED ORDER — OXYCODONE HCL 5 MG PO TABS: 5.0000 mg | ORAL_TABLET | Freq: Four times a day (QID) | ORAL | 0 refills | Status: AC | PRN

## 2020-07-14 NOTE — Discharge Summary (Signed)
Physician Discharge Summary  Crystal Mason WUJ:[id number] DOB: [birth year] DOA: 07/07/2020  PCP: Georgianne Fick, MD  Admit date: 07/07/2020 Discharge date: 07/14/2020  Recommendations for Outpatient Follow-up:  Follow-up surgery s/p volvulus  Follow-up left shoulder pain as needed   Follow-up Information     Gaynelle Adu, MD Follow up in 1 week(s).   Specialty: General Surgery Why: Dr. Tawana Scale office will call you with a follow-up appointment for staple removal. Please arrive 15 minutes prior to your appointment for paperwork. Please bring a copy of your photo ID and insurance card. Contact information: [address] [place] [address] [phone]                Discharge Diagnoses: Principal diagnosis is #1 Principal Problem:   Sigmoid volvulus (HCC) Active Problems:   Left shoulder pain   Hypothyroidism   IBS (irritable bowel syndrome)   Chronic constipation  Discharge Condition: improved Disposition: home  Diet recommendation:  Diet Orders (From admission, onward)     Start     Ordered   07/14/20 0000  Diet general        07/14/20 1119   07/13/20 0938  DIET SOFT Room service appropriate? Yes; Fluid consistency: Thin  Diet effective now       Question Answer Comment  Room service appropriate? Yes   Fluid consistency: Thin      07/13/20 0939             Filed Weights   07/07/20 2313 07/10/20 0952 07/11/20 0500  Weight: 72.6 kg 72.6 kg 73.4 kg    HPI/Hospital Course:   55 year old woman presented with abdominal pain.  Found to have sigmoid volvulus.  Status post sigmoid colectomy 6/22.  Postoperative course uncomplicated, discharged home in good condition.  * Sigmoid volvulus (HCC) --s/p sigmoid colectomy 6/23 -- Improving.  Bowels moving.  Tolerating soft diet.  Home today per surgery.  Left shoulder pain -- No shoulder pain prior to admission or surgery.  Had bilateral shoulder pain after surgery, right side quickly  resolved, continues to have left-sided pain  -- Seems to have localized at the distal shoulder at this time.  There is no pain on examination and musculoskeletal testing is unremarkable.  She does report that when she takes a deep breath she feels a catch in her left side underneath her ribs and when she does this, she feels pain in the left shoulder.  Her abdomen remains bloated status post surgery.  Suspect she has diaphragmatic irritation causing referred pain to the left shoulder, given abdominal bloating and only pain to palpation being underneath the left ribs.  She has no pain in the shoulder itself with palpation or movement, her pain is intermittent and exacerbated by position and consistent with spasms.  She has no history of heart disease or VTE and no signs or symptoms to suggest this.  Most likely explanation is referred pain at this time.  Discussed in detail with husband, offered further testing but at this point we will proceed with conservative management.  Spontaneous improvement expected.  Hypothyroidism --continue levothyroxine    Consults:  GI General surgery   Procedures:  Colonoscopy - Preparation of the colon was poor.                           - Dilated in the sigmoid colon, in the descending  colon and in the transverse colon. Decompression                           performed. LAPAROSCOPY DIAGNOSTIC SIGMOID COLECTOMY LAPAROSCOPIC BILATERAL TAP BLOCK  RIGID proctoscopy  Today's assessment: See progress note same day  Discharge Instructions  Discharge Instructions     Diet general   Complete by: As directed    Discharge instructions   Complete by: As directed    Call your physician or seek immediate medical attention for pain, vomiting, fever, difficulty breathing, shortness of breath or worsening of condition.   Discharge wound care:   Complete by: As directed    Wound care as per surgery office.   Increase activity slowly    Complete by: As directed       Allergies as of 07/14/2020       Reactions   Sulfa Antibiotics Rash        Medication List     STOP taking these medications    Motegrity 2 MG Tabs Generic drug: Prucalopride Succinate   Trulance 3 MG Tabs Generic drug: Plecanatide       TAKE these medications    CALCIUM CITRATE + D PO Take 1 tablet by mouth daily.   cycloSPORINE 0.05 % ophthalmic emulsion Commonly known as: RESTASIS Place 1 drop into both eyes 2 (two) times daily.   Junel 1/20 1-20 MG-MCG tablet Generic drug: norethindrone-ethinyl estradiol Take 1 tablet by mouth daily.   levothyroxine 100 MCG tablet Commonly known as: SYNTHROID Take 100 mcg by mouth daily.   OVER THE COUNTER MEDICATION Take 1 tablet by mouth daily. Ultra flora spectrum   oxyCODONE 5 MG immediate release tablet Commonly known as: Oxy IR/ROXICODONE Take 1 tablet (5 mg total) by mouth every 6 (six) hours as needed for severe pain or breakthrough pain (5mg  for moderate pain, 10mg  for severe pain).   tiZANidine 2 MG tablet Commonly known as: ZANAFLEX Take 1 tablet (2 mg total) by mouth every 8 (eight) hours as needed for muscle spasms.   TURMERIC PO Take 1 tablet by mouth daily.               Discharge Care Instructions  (From admission, onward)           Start     Ordered   07/14/20 0000  Discharge wound care:       Comments: Wound care as per surgery office.   07/14/20 1119           Allergies  Allergen Reactions   Sulfa Antibiotics Rash    The results of significant diagnostics from this hospitalization (including imaging, microbiology, ancillary and laboratory) are listed below for reference.    Significant Diagnostic Studies: DG Abd 1 View  Result Date: 07/08/2020 CLINICAL DATA:  55 year old female status post endoscopic decompression of sigmoid volvulus. EXAM: ABDOMEN - 1 VIEW COMPARISON:  CT Abdomen and Pelvis 0205 hours. FINDINGS: Portable AP supine view at  0830 hours. Improved bowel gas pattern from 0205 hours today, with residual gas distended redundant sigmoid measuring 8-9 mm diameter. Splenic flexure is still gas distended. But the descending and mid transverse colon are no longer distended. Stable dystrophic calcification in the left hemipelvis and excreted IV contrast now in the urinary bladder. Negative visible right lung base. No acute osseous abnormality identified. IMPRESSION: Improved bowel gas pattern from 0205 hours today, residual distension of the sigmoid and splenic flexure, but the upstream colon  is now largely normal. Electronically Signed   By: Odessa FlemingH  Hall M.D.   On: 07/08/2020 08:59   CT ABDOMEN PELVIS W CONTRAST  Result Date: 07/08/2020 CLINICAL DATA:  Acute nonlocalized abdominal pain. Pt presents with 2 day hx of abd pain. Pt with IBS-C and pseudo-obstruction. States her last BM was 6/16. Pt denies vomiting. no other complaints EXAM: CT ABDOMEN AND PELVIS WITH CONTRAST TECHNIQUE: Multidetector CT imaging of the abdomen and pelvis was performed using the standard protocol following bolus administration of intravenous contrast. CONTRAST:  100mL OMNIPAQUE IOHEXOL 300 MG/ML  SOLN COMPARISON:  CT abdomen pelvis 04/11/2013. FINDINGS: Lower chest: No acute abnormality. Hepatobiliary: No focal liver abnormality. No gallstones, gallbladder wall thickening, or pericholecystic fluid. No biliary dilatation. Pancreas: No focal lesion. Normal pancreatic contour. No surrounding inflammatory changes. No main pancreatic ductal dilatation. Spleen: Normal in size without focal abnormality. Adrenals/Urinary Tract: No adrenal nodule bilaterally. Bilateral kidneys enhance symmetrically. No hydronephrosis. No hydroureter. The urinary bladder is unremarkable. Stomach/Bowel: There is swirling of the mesentery within the left lower abdomen with a double beak sign associated with the sigmoid colon (proximal sigmoid colon transition point on coronal image 49, distal  sigmoid colon transition point on coronal image 50). The sigmoid colon is distended with gas measuring up to 8 cm within associated air-fluid level (2:9, 5:69). Stomach is within normal limits. No evidence of small bowel wall thickening or dilatation. Stool is noted within the transverse colon. The rectum is decompressed. No pneumatosis. Appendix appears normal. Vascular/Lymphatic: No significant vascular findings are present. No enlarged abdominal or pelvic lymph nodes. Reproductive: Uterus and bilateral adnexa are unremarkable. Other: Redemonstration nonspecific left pelvic 2 cm calcification (2:72). Several phleboliths also noted within the pelvis. Trace free fluid within the pelvis. No organized fluid collection. No pneumoperitoneum. Musculoskeletal: No abdominal wall hernia or abnormality. No suspicious lytic or blastic osseous lesions. No acute displaced fracture. IMPRESSION: Findings suggestive of a sigmoid volvulus. These results were called by telephone at the time of interpretation on 07/08/2020 at 3:11 am to provider [facility] , who verbally acknowledged these results. Electronically Signed   By: Tish FredericksonMorgane  Naveau M.D.   On: 07/08/2020 03:12   DG Shoulder Left  Result Date: 07/13/2020 CLINICAL DATA:  Shoulder pain in a 55 year old female. History of abdominal surgery for cecal volvulus. EXAM: LEFT SHOULDER - 2+ VIEW COMPARISON:  None FINDINGS: No sign of fracture or dislocation. No significant degenerative change. IMPRESSION: Negative. Electronically Signed   By: Donzetta KohutGeoffrey  Wile M.D.   On: 07/13/2020 11:42   DG Abdomen Acute W/Chest  Result Date: 07/08/2020 CLINICAL DATA:  2 day hx of abd pain. Pt with IBS-C and pseudo-obstruction. EXAM: DG ABDOMEN ACUTE WITH 1 VIEW CHEST COMPARISON:  x ray abdomen 04/30/2017, CT abdomen pelvis 04/11/2048 FINDINGS: The heart size and mediastinal contours are within normal limits. Elevated left hemidiaphragm. Left base atelectasis. No focal consolidation. No  pulmonary edema. No pleural effusion. No pneumothorax. Lead pipe appearance of the left colon. Gaseous dilatation of the large bowel. Stool overlies the pelvis. No evidence of free intraperitoneal air. Similar-appearing chronic 2.9 cm calcific density within the left pelvis. Several phleboliths are noted overlying the pelvis. No acute osseous abnormality. IMPRESSION: 1. Gaseous dilatation of the large bowel with appearance of the left colon consistent with inflammatory bowel disease. If a CT abdomen pelvis is clinically indicated, please obtain with intravenous contrast. 2. Limited evaluation for free intraperitoneal gas. 3. No acute cardiopulmonary disease. Electronically Signed   By: Tish FredericksonMorgane  Naveau  M.D.   On: 07/08/2020 00:57    Microbiology: Recent Results (from the past 240 hour(s))  Resp Panel by RT-PCR (Flu A&B, Covid) Nasopharyngeal Swab     Status: None   Collection Time: 07/08/20  3:43 AM   Specimen: Nasopharyngeal Swab; Nasopharyngeal(NP) swabs in vial transport medium  Result Value Ref Range Status   SARS Coronavirus 2 by RT PCR NEGATIVE NEGATIVE Final    Comment: (NOTE) SARS-CoV-2 target nucleic acids are NOT DETECTED.  The SARS-CoV-2 RNA is generally detectable in upper respiratory specimens during the acute phase of infection. The lowest concentration of SARS-CoV-2 viral copies this assay can detect is 138 copies/mL. A negative result does not preclude SARS-Cov-2 infection and should not be used as the sole basis for treatment or other patient management decisions. A negative result may occur with  improper specimen collection/handling, submission of specimen other than nasopharyngeal swab, presence of viral mutation(s) within the areas targeted by this assay, and inadequate number of viral copies(<138 copies/mL). A negative result must be combined with clinical observations, patient history, and epidemiological information. The expected result is Negative.  Fact Sheet for  Patients:  BloggerCourse.com  Fact Sheet for Healthcare Providers:  SeriousBroker.it  This test is no t yet approved or cleared by the [place] FDA and  has been authorized for detection and/or diagnosis of SARS-CoV-2 by FDA under an Emergency Use Authorization (EUA). This EUA will remain  in effect (meaning this test can be used) for the duration of the COVID-19 declaration under Section 564(b)(1) of the Act, 21 U.S.C.section 360bbb-3(b)(1), unless the authorization is terminated  or revoked sooner.       Influenza A by PCR NEGATIVE NEGATIVE Final   Influenza B by PCR NEGATIVE NEGATIVE Final    Comment: (NOTE) The Xpert Xpress SARS-CoV-2/FLU/RSV plus assay is intended as an aid in the diagnosis of influenza from Nasopharyngeal swab specimens and should not be used as a sole basis for treatment. Nasal washings and aspirates are unacceptable for Xpert Xpress SARS-CoV-2/FLU/RSV testing.  Fact Sheet for Patients: BloggerCourse.com  Fact Sheet for Healthcare Providers: SeriousBroker.it  This test is not yet approved or cleared by the [place] FDA and has been authorized for detection and/or diagnosis of SARS-CoV-2 by FDA under an Emergency Use Authorization (EUA). This EUA will remain in effect (meaning this test can be used) for the duration of the COVID-19 declaration under Section 564(b)(1) of the Act, 21 U.S.C. section 360bbb-3(b)(1), unless the authorization is terminated or revoked.  Performed at [facility], [address] [place], [place] [address]      Labs: Basic Metabolic Panel: Recent Labs  Lab 07/08/20 0901 07/09/20 0347 07/10/20 0414 07/11/20 0459 07/12/20 0408 07/13/20 0515  NA  --  140 138 137 138 137  K  --  3.6 4.1 4.2 3.6 3.5  CL  --  112* 108 106 106 104  CO2  --  24 24 22 26 27   GLUCOSE  --  113* 109* 108* 86 89  BUN  --   6 6 6 7 9   CREATININE  --  0.92 0.74 0.83 0.86 0.88  CALCIUM  --  7.9* 8.5* 8.1* 8.4* 8.4*  MG 1.9 1.9  --   --   --   --    Liver Function Tests: Recent Labs  Lab 07/08/20 0030  AST 22  ALT 19  ALKPHOS 67  BILITOT 0.7  PROT 7.6  ALBUMIN 3.9   Recent Labs  Lab  07/08/20 0030  LIPASE 27    CBC: Recent Labs  Lab 07/08/20 0030 07/09/20 0347 07/10/20 0414 07/11/20 0459 07/12/20 0408 07/13/20 0515  WBC 11.3* 6.9 5.9 16.1* 11.3* 7.9  NEUTROABS 9.1*  --  3.1  --   --   --   HGB 13.2 10.8* 11.6* 12.0 11.8* 11.8*  HCT 40.2 32.9* 35.4* 36.6 36.4 35.7*  MCV 94.6 96.8 95.9 95.3 96.6 96.0  PLT 349 250 277 290 264 289   Principal Problem:   Sigmoid volvulus (HCC) Active Problems:   Left shoulder pain   Hypothyroidism   IBS (irritable bowel syndrome)   Chronic constipation   Time coordinating discharge: 35 minutes  Signed:  [name], MD  [facility]  07/14/2020, 3:17 PM

## 2020-07-14 NOTE — Progress Notes (Signed)
Nurse reviewed discharge instructions with pt.  Pt verbalized understanding of discharge instructions, follow up appointments and new medications.  No concerns at time of discharge. 

## 2020-07-14 NOTE — Progress Notes (Signed)
PROGRESS NOTE  Crystal Mason QIO:[id number] DOB: 10/22/1965 DOA: 07/07/2020 PCP: Georgianne Fick, MD  Brief History   55 year old woman presented with abdominal pain.  Found to have sigmoid volvulus.  Status post sigmoid colectomy 6/22.  Awaiting return of bowel function and anticipate discharge home.  Has left shoulder pain of unclear etiology favored benign.  A & P  * Sigmoid volvulus (HCC) --s/p sigmoid colectomy 6/23 -- Improving.  Bowels moving.  Tolerating soft diet.  Management per surgery.  Left shoulder pain -- No shoulder pain prior to admission or surgery.  Had bilateral shoulder pain after surgery, right side quickly resolved, continues to have left-sided pain  -- Seems to have localized at the distal shoulder at this time.  There is no pain on examination and musculoskeletal testing is unremarkable.  She does report that when she takes a deep breath she feels a catch in her left side underneath her ribs and when she does this, she feels pain in the left shoulder.  Her abdomen remains bloated status post surgery.  Suspect she has diaphragmatic irritation causing referred pain to the left shoulder, given abdominal bloating and only pain to palpation being underneath the left ribs.  She has no pain in the shoulder itself with palpation or movement, her pain is intermittent and exacerbated by position and consistent with spasms.  She has no history of heart disease or VTE and no signs or symptoms to suggest this.  Most likely explanation is referred pain at this time.  Discussed in detail with husband, offered further testing but at this point we will proceed with conservative management.  Spontaneous improvement expected.  Hypothyroidism --continue levothyroxine  Disposition Plan:  Discussion:   Status is: Inpatient  Remains inpatient appropriate because:Inpatient level of care appropriate due to severity of illness  Dispo: The patient is from: Home              Anticipated  d/c is to: Home              Patient currently is not medically stable to d/c.   Difficult to place patient No  DVT prophylaxis: enoxaparin (LOVENOX) injection 40 mg Start: 07/11/20 0800 SCD's Start: 07/10/20 1714 Place TED hose Start: 07/10/20 1714 SCD's Start: 07/09/20 0722   Code Status: Full Code Level of care: Med-Surg Family Communication: husband at bedside 6/26  [name], MD  [facility] Direct contact: see [email or web address] (further directions at bottom of note if needed) 7PM-7AM contact night coverage as at bottom of note 07/14/2020, 9:56 AM  LOS: 6 days     Consults:  GI General surgery   Procedures:  Colonoscopy - Preparation of the colon was poor.                           - Dilated in the sigmoid colon, in the descending                           colon and in the transverse colon. Decompression                           performed. LAPAROSCOPY DIAGNOSTIC SIGMOID COLECTOMY LAPAROSCOPIC BILATERAL TAP BLOCK  RIGID proctoscopy  Interval History/Subjective  CC: f/u abd pain  Abdominal pain decreasing, bowels moving, passing gas.  No nausea or vomiting.  Tolerating soft foods.  Reports ongoing left shoulder pain which now  seems to have localized to the acromion process and distal part of the shoulder.  Known pain with movement of arm no change in strength.  Aggravated by position particularly with a rounded back or when sitting up in bed.  Improved with sitting up straight or lying flat.  Associated with pain from deep breath, feels like there is a catch in her left side underneath her ribs which is tender to palpation.  No previous shoulder injury.  Medication has helped although she does have spasms of pain at times.  Abdomen remains bloated significantly.  ROS  Objective   Vitals:  Vitals:   07/13/20 2304 07/14/20 0612  BP: 121/69 133/76  Pulse: 68 62  Resp: 16 15  Temp: 98.2 F (36.8 C) 98.9 F (37.2 C)  SpO2: 97% 98%    Exam: Physical  Exam Constitutional:      General: She is not in acute distress.    Appearance: She is well-developed.  Cardiovascular:     Rate and Rhythm: Normal rate and regular rhythm.     Heart sounds: No murmur heard. Pulmonary:     Effort: Pulmonary effort is normal. No respiratory distress.     Breath sounds: Normal breath sounds.  Abdominal:     General: There is distension.     Tenderness: There is abdominal tenderness (Focal tenderness left lateral upper quadrant underneath the ribs.).  Musculoskeletal:        General: No swelling.     Comments: Left arm and shoulder.  No pain in trapezius, clavicle or palpation of the lateral shoulder or acromion process.  Full range of movement.  Strength intact.  Neurological:     Mental Status: She is alert.  Psychiatric:        Mood and Affect: Mood normal.        Behavior: Behavior normal.    I have personally reviewed the labs and other data, making special note of:   Today's Data    Scheduled Meds:  acetaminophen  1,000 mg Oral Q6H   Chlorhexidine Gluconate Cloth  6 each Topical Daily   cycloSPORINE  1 drop Both Eyes BID   enoxaparin (LOVENOX) injection  40 mg Subcutaneous Q24H   feeding supplement  237 mL Oral BID BM   ketorolac  30 mg Intravenous Q6H   levothyroxine  100 mcg Oral QAC breakfast   norethindrone-ethinyl estradiol  1 tablet Oral Daily   polyethylene glycol  17 g Oral Daily   saccharomyces boulardii  250 mg Oral BID   Continuous Infusions:    Principal Problem:   Sigmoid volvulus (HCC) Active Problems:   Left shoulder pain   Hypothyroidism   IBS (irritable bowel syndrome)   Chronic constipation   LOS: 6 days   How to contact the [facility] Attending or Consulting provider 7A - 7P or covering provider during after hours 7P -7A, for this patient?  Check the care team in [facility] and look for a) attending/consulting [facility] provider listed and b) the [facility] team listed Log into [email or web address] and use [place]'s universal password  to access. If you do not have the password, please contact the hospital operator. Locate the [facility] provider you are looking for under [facility] and page to a number that you can be directly reached. If you still have difficulty reaching the provider, please page the [facility] (Director on Call) for the Hospitalists listed on amion for assistance.

## 2020-07-14 NOTE — Progress Notes (Signed)
4 Days Post-Op   Subjective/Chief Complaint: Left shoulder x-rays negative Slightly improved with Toradol and Zanaflex Abd - sore Had two more BM Tolerating diet    Objective: Vital signs in last 24 hours: Temp:  [98.2 F (36.8 C)-98.9 F (37.2 C)] 98.9 F (37.2 C) (06/26 0612) Pulse Rate:  [62-69] 62 (06/26 0612) Resp:  [15-18] 15 (06/26 0612) BP: (101-133)/(61-76) 133/76 (06/26 0612) SpO2:  [97 %-98 %] 98 % (06/26 0612) Last BM Date: 07/13/20  Intake/Output from previous day: 06/25 0701 - 06/26 0700 In: 720 [P.O.:720] Out: 0  Intake/Output this shift: Total I/O In: 240 [P.O.:240] Out: 0   General appearance: alert, cooperative, and no distress GI: soft, minimally distended; incisional tenderness Dressing removed - staple lines c/d/i  Lab Results:  Recent Labs    07/12/20 0408 07/13/20 0515  WBC 11.3* 7.9  HGB 11.8* 11.8*  HCT 36.4 35.7*  PLT 264 289   BMET Recent Labs    07/12/20 0408 07/13/20 0515  NA 138 137  K 3.6 3.5  CL 106 104  CO2 26 27  GLUCOSE 86 89  BUN 7 9  CREATININE 0.86 0.88  CALCIUM 8.4* 8.4*   PT/INR No results for input(s): LABPROT, INR in the last 72 hours. ABG No results for input(s): PHART, HCO3 in the last 72 hours.  Invalid input(s): PCO2, PO2  Studies/Results: DG Shoulder Left  Result Date: 07/13/2020 CLINICAL DATA:  Shoulder pain in a 55 year old female. History of abdominal surgery for cecal volvulus. EXAM: LEFT SHOULDER - 2+ VIEW COMPARISON:  None FINDINGS: No sign of fracture or dislocation. No significant degenerative change. IMPRESSION: Negative. Electronically Signed   By: Donzetta Kohut M.D.   On: 07/13/2020 11:42    Anti-infectives: Anti-infectives (From admission, onward)    Start     Dose/Rate Route Frequency Ordered Stop   07/10/20 0600  cefoTEtan (CEFOTAN) 2 g in sodium chloride 0.9 % 100 mL IVPB        2 g 200 mL/hr over 30 Minutes Intravenous On call to O.R. 07/09/20 0721 07/10/20 1235   07/09/20  1400  neomycin (MYCIFRADIN) tablet 1,000 mg       See Hyperspace for full Linked Orders Report.   1,000 mg Oral 3 times per day 07/09/20 0721 07/09/20 2149   07/09/20 1400  metroNIDAZOLE (FLAGYL) tablet 1,000 mg       See Hyperspace for full Linked Orders Report.   1,000 mg Oral 3 times per day 07/09/20 0721 07/09/20 2148       Assessment/Plan: POD 4, s/p diagnostic laparoscopy, exploratory laparotomy, sigmoid colectomy for sigmoid volvulus, Dr. Andrey Campanile, 6/22 - Advance to soft diet - D/C Entereg - Gi recommended holding home motility agents for 1 week and restarting if no BM. May not need anymore after surgery per GI - Cont miralax - Am labs - Mobilize - Pulm toilet - Path pending    FEN - FLD, IVF per TRH, miralax VTE - SCDs, Lovenox ID - Neomycin/Flagyl prep. Cefotetan peri-op. None currently. WBC 11.8 Afebrile. Foley - out POD 1. Voiding.   Hypothyroidism    Ready for discharge from surgical standpoint - pain meds/ muscle relaxant sent to pharmacy.  Follow-up will be arranged for staple removal.  Hold motility agent for now per GI.  LOS: 6 days    Wynona Luna 07/14/2020

## 2022-03-12 ENCOUNTER — Encounter: Payer: Self-pay | Admitting: Podiatry

## 2022-03-12 ENCOUNTER — Ambulatory Visit (INDEPENDENT_AMBULATORY_CARE_PROVIDER_SITE_OTHER): Payer: 59

## 2022-03-12 ENCOUNTER — Ambulatory Visit (INDEPENDENT_AMBULATORY_CARE_PROVIDER_SITE_OTHER): Payer: 59 | Admitting: Podiatry

## 2022-03-12 DIAGNOSIS — M7741 Metatarsalgia, right foot: Secondary | ICD-10-CM

## 2022-03-12 DIAGNOSIS — M778 Other enthesopathies, not elsewhere classified: Secondary | ICD-10-CM

## 2022-03-12 DIAGNOSIS — M79672 Pain in left foot: Secondary | ICD-10-CM

## 2022-03-12 DIAGNOSIS — M79671 Pain in right foot: Secondary | ICD-10-CM

## 2022-03-12 DIAGNOSIS — M7742 Metatarsalgia, left foot: Secondary | ICD-10-CM | POA: Diagnosis not present

## 2022-03-12 MED ORDER — MELOXICAM 15 MG PO TABS: 15.0000 mg | ORAL_TABLET | Freq: Every day | ORAL | 0 refills | Status: DC

## 2022-03-12 NOTE — Progress Notes (Signed)
  Subjective:  Patient ID: LOAN BONITZ, female    DOB: November 07, 1965,   MRN: [id number]  Chief Complaint  Patient presents with   Foot Pain    Bilateral foot pain on going for 6  months     57 y.o. female presents for concern of bilateral foot pain that has been going on for 6 months. Relates painful when walking on it but the pain varies. She has tried brooks and has superfeet but still getting pain. Relates a history of wearing high heels and was working a lot and then started working from home and now getting more pain in the ball when working out.   . Denies any other pedal complaints. Denies n/v/f/c.   Past Medical History:  Diagnosis Date   Chronic intestinal pseudo-obstruction    IBS (irritable colon syndrome)    Constipation   Thyroid disease     Objective:  Physical Exam: Vascular: DP/PT pulses 2/4 bilateral. CFT <3 seconds. Normal hair growth on digits. No edema.  Skin. No lacerations or abrasions bilateral feet.  Musculoskeletal: MMT 5/5 bilateral lower extremities in DF, PF, Inversion and Eversion. Deceased ROM in DF of ankle joint. Tender to plantar second metatasophalangeal joint bilateral. Some pain to third MPJ as well. Some pain in second interspace. No palpable mulders click. Some pain to first MPJ on the right as well. Right seems to be more painful for her.  Neurological: Sensation intact to light touch.   Assessment:   1. Capsulitis of foot, right   2. Capsulitis of foot, left   3. Metatarsalgia of both feet      Plan:  Patient was evaluated and treated and all questions answered. Discussed capsulitis and inflammation of joint and treatment options with patient.  Radiographs reviewed and discussed with patient.  Injection deferred today Discussed stiff sole shoes and recommend use of metatarsal pad  Meloxicam sent to pharmacy  Discussed if pain does not improve may consider PT and/or MRI for further surgical planning.  Patient to return in 6 weeks or  sooner if concerns arise.    Lorenda Peck, DPM

## 2022-04-23 ENCOUNTER — Encounter: Payer: Self-pay | Admitting: Podiatry

## 2022-04-23 ENCOUNTER — Ambulatory Visit (INDEPENDENT_AMBULATORY_CARE_PROVIDER_SITE_OTHER): Payer: 59 | Admitting: Podiatry

## 2022-04-23 DIAGNOSIS — M7742 Metatarsalgia, left foot: Secondary | ICD-10-CM | POA: Diagnosis not present

## 2022-04-23 DIAGNOSIS — M7741 Metatarsalgia, right foot: Secondary | ICD-10-CM

## 2022-04-23 DIAGNOSIS — M778 Other enthesopathies, not elsewhere classified: Secondary | ICD-10-CM

## 2022-04-23 MED ORDER — MELOXICAM 15 MG PO TABS: 15.0000 mg | ORAL_TABLET | Freq: Every day | ORAL | 0 refills | Status: DC

## 2022-04-23 NOTE — Progress Notes (Signed)
  Subjective:  Patient ID: Crystal Mason, female    DOB: [birth year],   MRN: [id number]  Chief Complaint  Patient presents with   Foot Pain    bilateral foot pain     57 y.o. female presents for follow-up of bilateral capsulitis and metatarsalgia. Relates it is doing some better and mostly better when taking meloxicam. Requesting refill today. Relates the padding does help but has to change out often. .   . Denies any other pedal complaints. Denies n/v/f/c.   Past Medical History:  Diagnosis Date   Chronic intestinal pseudo-obstruction    IBS (irritable colon syndrome)    Constipation   Thyroid disease     Objective:  Physical Exam: Vascular: DP/PT pulses 2/4 bilateral. CFT <3 seconds. Normal hair growth on digits. No edema.  Skin. No lacerations or abrasions bilateral feet.  Musculoskeletal: MMT 5/5 bilateral lower extremities in DF, PF, Inversion and Eversion. Deceased ROM in DF of ankle joint. Tender to plantar second metatasophalangeal joint bilateral. Some pain to third MPJ as well. Some pain in second interspace. No palpable mulders click. Some pain to first MPJ on the right as well. Right seems to be more painful for her. Is improved from prior Neurological: Sensation intact to light touch.   Assessment:   1. Capsulitis of foot, right   2. Capsulitis of foot, left   3. Metatarsalgia of both feet       Plan:  Patient was evaluated and treated and all questions answered. Discussed capsulitis and inflammation of joint and treatment options with patient.  Radiographs reviewed and discussed with patient.  Injection deferred today Discussed stiff sole shoes and recommend use of metatarsal pad Meloxicam refilled.  Discussed CMO and patient will look into.  Discussed if pain does not improve may consider PT and/or MRI for further surgical planning.  Patient to return as needed.    Lorenda Peck, DPM

## 2022-05-25 IMAGING — DX DG SHOULDER 2+V*L*
2 series · 2 of 2 positions shown · non-contrast
Comparison: None

CLINICAL DATA: Shoulder pain in a 55-year-old female. History of
abdominal surgery for cecal volvulus.

EXAM:
LEFT SHOULDER - 2+ VIEW

[shoulder ap]
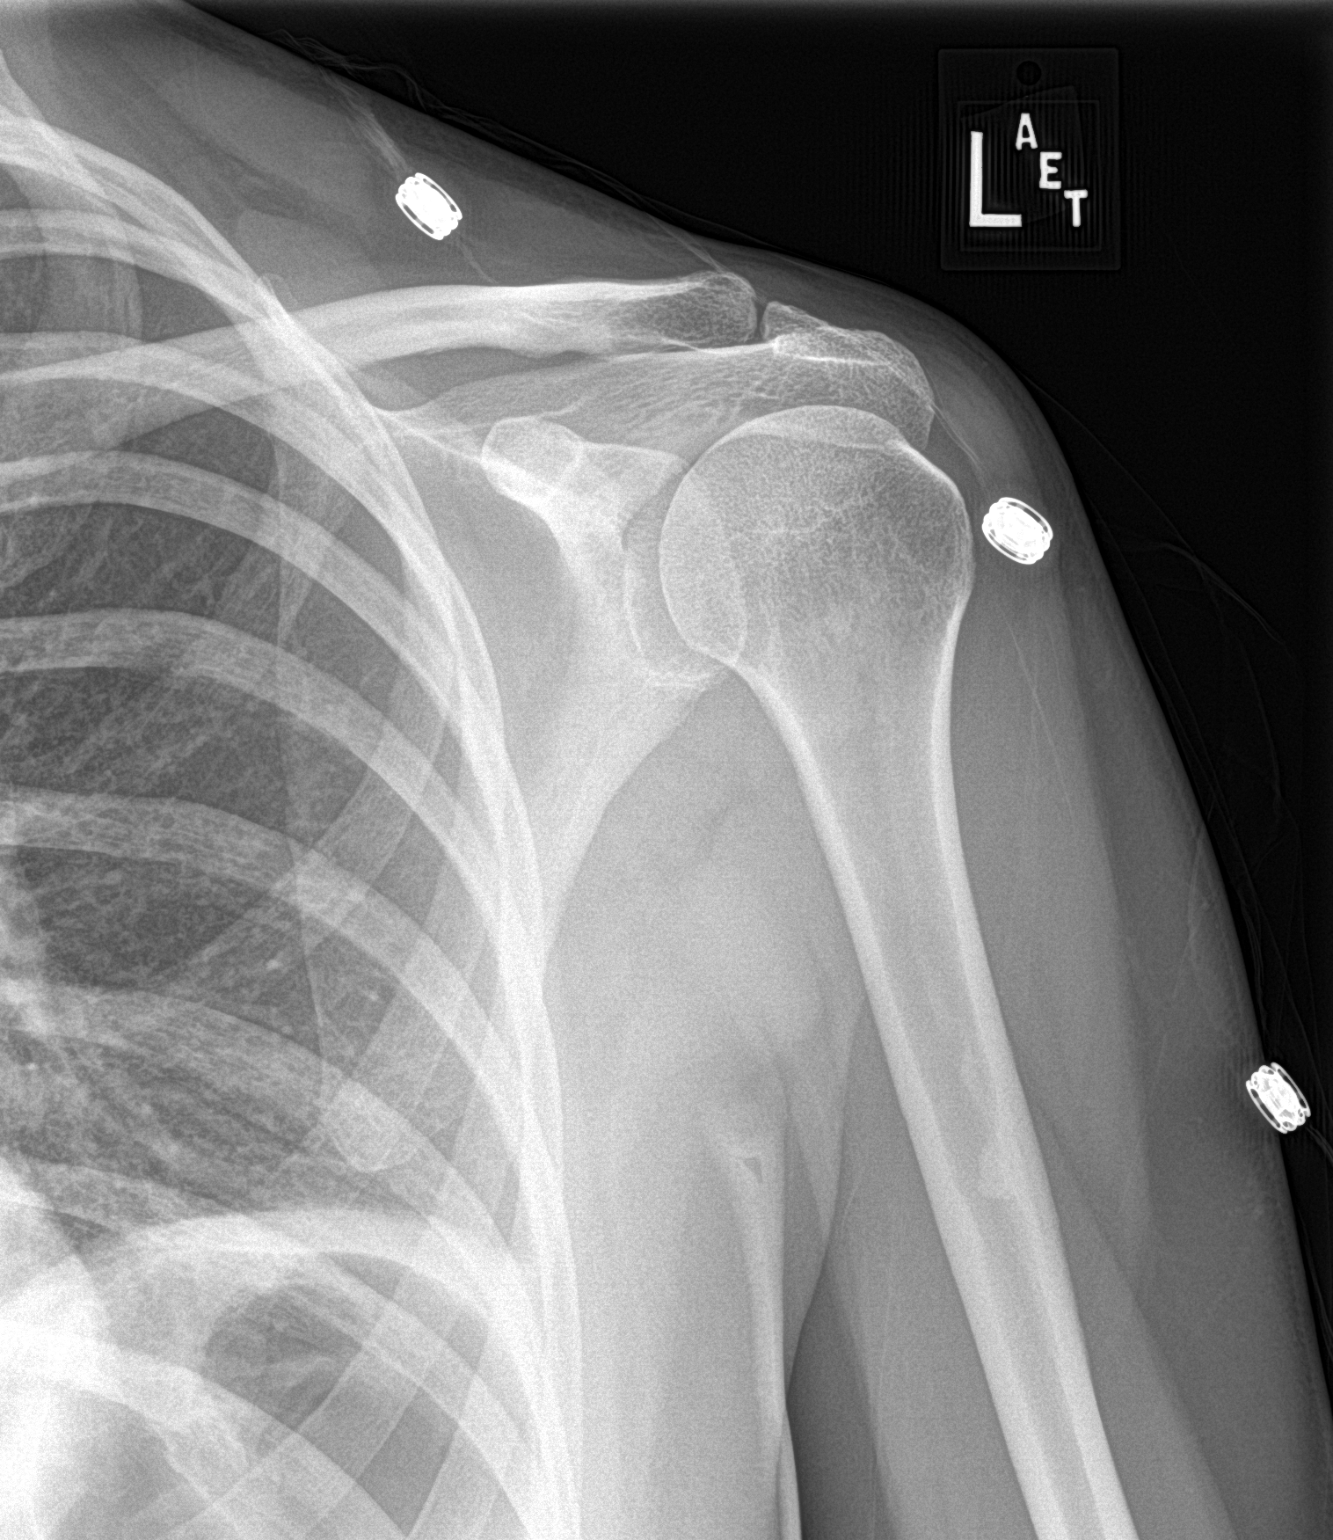

[shoulder obl]
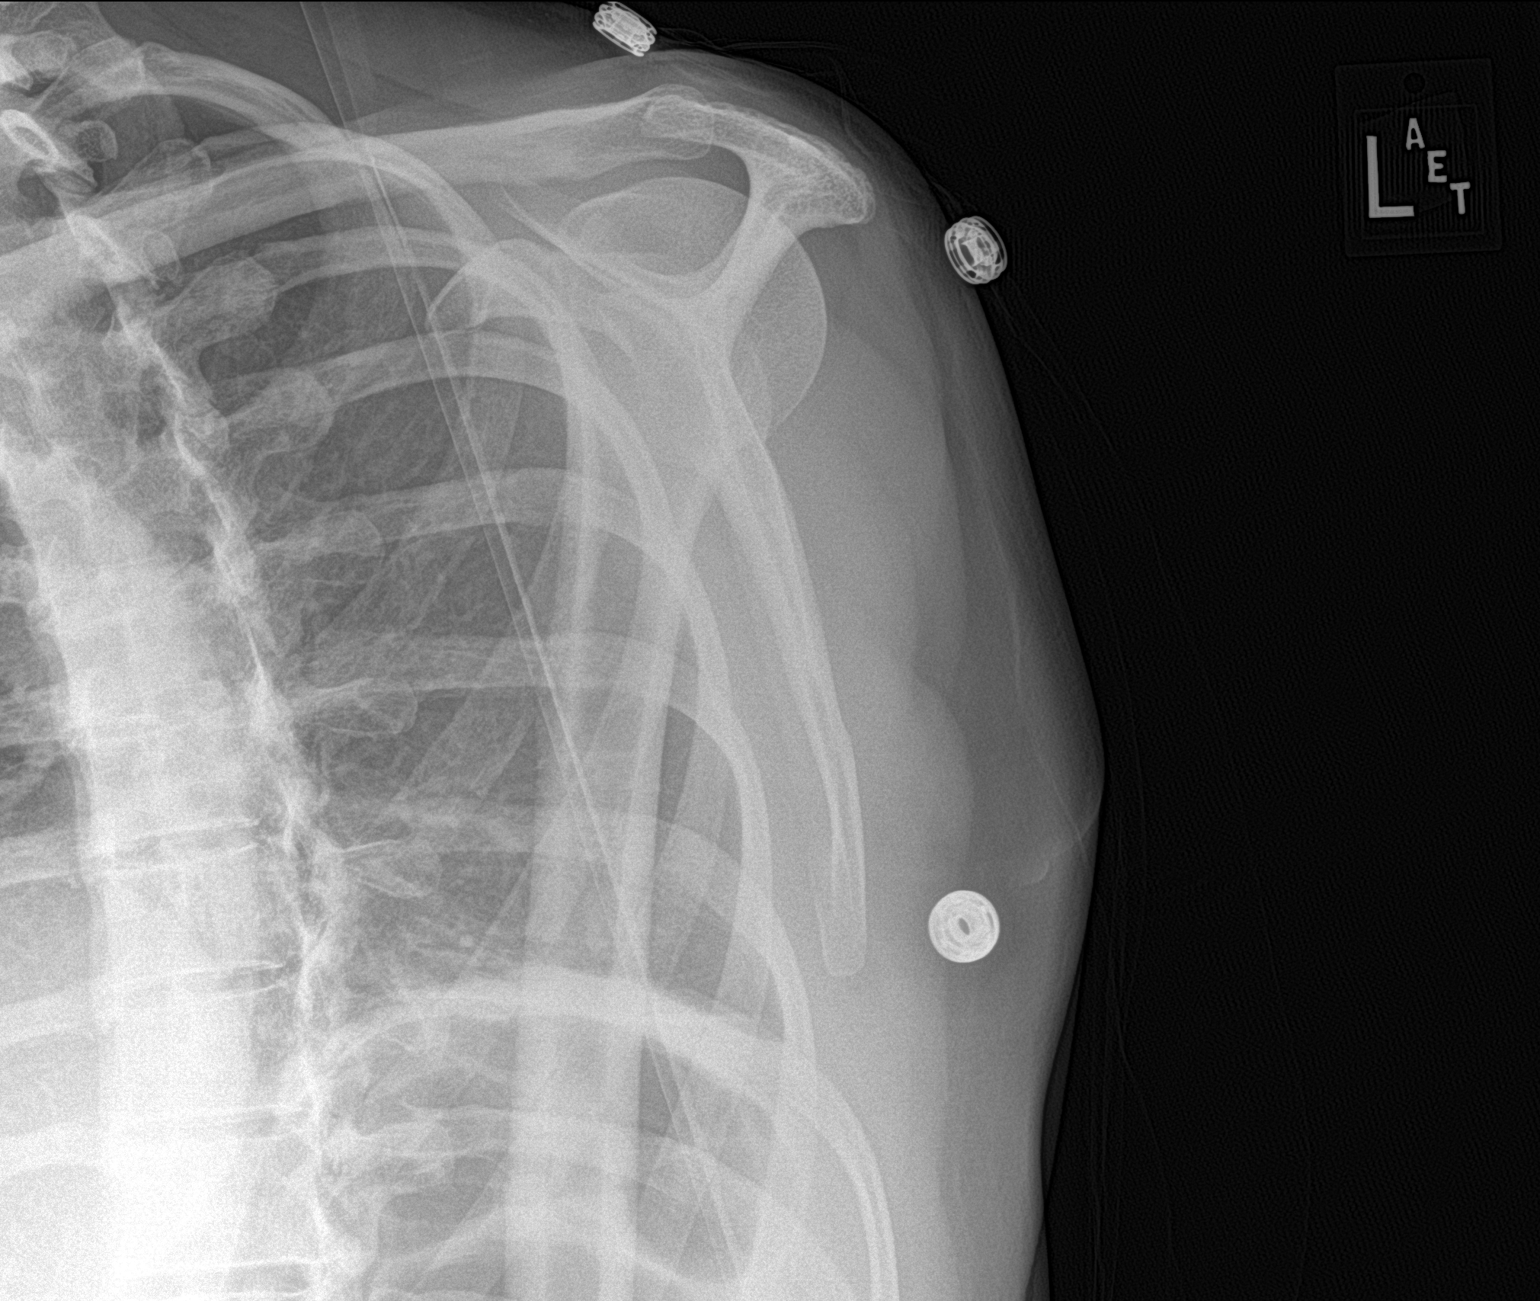

[2 of 2 positions shown; findings below may reference images not displayed]

FINDINGS: No sign of fracture or dislocation. No significant degenerative
change.
IMPRESSION: Negative.

## 2022-07-08 ENCOUNTER — Other Ambulatory Visit: Payer: Self-pay | Admitting: Podiatry

## 2022-08-06 ENCOUNTER — Other Ambulatory Visit: Payer: Self-pay | Admitting: Podiatry

## 2023-03-25 ENCOUNTER — Other Ambulatory Visit: Payer: Self-pay | Admitting: Internal Medicine

## 2023-03-25 DIAGNOSIS — Z8249 Family history of ischemic heart disease and other diseases of the circulatory system: Secondary | ICD-10-CM

## 2023-04-01 ENCOUNTER — Ambulatory Visit (HOSPITAL_COMMUNITY)
Admission: RE | Admit: 2023-04-01 | Discharge: 2023-04-01 | Disposition: A | Discharge status: Home / Self Care | Payer: Self-pay | Source: Ambulatory Visit | Attending: Internal Medicine | Admitting: Internal Medicine

## 2023-04-01 DIAGNOSIS — Z8249 Family history of ischemic heart disease and other diseases of the circulatory system: Secondary | ICD-10-CM | POA: Insufficient documentation

## 2024-01-26 ENCOUNTER — Other Ambulatory Visit: Payer: Self-pay | Admitting: Family Medicine

## 2024-01-26 ENCOUNTER — Other Ambulatory Visit: Payer: Self-pay | Admitting: Obstetrics & Gynecology

## 2024-01-26 DIAGNOSIS — R928 Other abnormal and inconclusive findings on diagnostic imaging of breast: Secondary | ICD-10-CM

## 2024-02-04 ENCOUNTER — Ambulatory Visit: Admission: RE | Admit: 2024-02-04 | Source: Ambulatory Visit

## 2024-02-04 ENCOUNTER — Other Ambulatory Visit: Payer: Self-pay | Admitting: Obstetrics & Gynecology

## 2024-02-04 DIAGNOSIS — R928 Other abnormal and inconclusive findings on diagnostic imaging of breast: Secondary | ICD-10-CM

## 2024-02-16 ENCOUNTER — Ambulatory Visit
Admission: RE | Admit: 2024-02-16 | Discharge: 2024-02-16 | Disposition: A | Source: Ambulatory Visit | Attending: Obstetrics & Gynecology | Admitting: Obstetrics & Gynecology

## 2024-02-16 DIAGNOSIS — R928 Other abnormal and inconclusive findings on diagnostic imaging of breast: Secondary | ICD-10-CM

## 2024-02-18 LAB — SURGICAL PATHOLOGY
